# Patient Record
Sex: Female | Born: 1990 | Hispanic: No | Marital: Single | Smoking: Never smoker
Health system: Southern US, Community
[De-identification: ages and names within clinical notes are randomized; demographics above are authoritative.]

## PROBLEM LIST (undated history)

## (undated) DIAGNOSIS — D649 Anemia, unspecified: Secondary | ICD-10-CM

## (undated) DIAGNOSIS — A599 Trichomoniasis, unspecified: Secondary | ICD-10-CM

## (undated) DIAGNOSIS — L309 Dermatitis, unspecified: Secondary | ICD-10-CM

## (undated) HISTORY — PX: COSMETIC SURGERY: SHX468

---
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
## 2011-04-06 LAB — ABO/RH: RH Type: POSITIVE

## 2011-04-12 LAB — HIV ANTIBODY (ROUTINE TESTING W REFLEX): HIV: NONREACTIVE

## 2011-04-12 LAB — ANTIBODY SCREEN: Antibody Screen: NEGATIVE

## 2011-07-06 LAB — RPR: RPR: NONREACTIVE

## 2011-08-22 ENCOUNTER — Inpatient Hospital Stay (HOSPITAL_COMMUNITY)
Admission: AD | Admit: 2011-08-22 | Discharge: 2011-08-22 | Disposition: A | Discharge status: Home / Self Care | Payer: Medicaid Other | Source: Ambulatory Visit | Attending: Obstetrics & Gynecology | Admitting: Obstetrics & Gynecology

## 2011-08-22 ENCOUNTER — Encounter (HOSPITAL_COMMUNITY): Payer: Self-pay

## 2011-08-22 DIAGNOSIS — R059 Cough, unspecified: Secondary | ICD-10-CM | POA: Insufficient documentation

## 2011-08-22 DIAGNOSIS — O99891 Other specified diseases and conditions complicating pregnancy: Secondary | ICD-10-CM | POA: Insufficient documentation

## 2011-08-22 DIAGNOSIS — O368190 Decreased fetal movements, unspecified trimester, not applicable or unspecified: Secondary | ICD-10-CM

## 2011-08-22 DIAGNOSIS — R05 Cough: Secondary | ICD-10-CM | POA: Insufficient documentation

## 2011-08-22 HISTORY — DX: Trichomoniasis, unspecified: A59.9

## 2011-08-22 HISTORY — DX: Anemia, unspecified: D64.9

## 2011-08-22 LAB — URINALYSIS, ROUTINE W REFLEX MICROSCOPIC
Nitrite: NEGATIVE
Protein, ur: NEGATIVE mg/dL
Specific Gravity, Urine: 1.025 (ref 1.005–1.030)
Urobilinogen, UA: 1 mg/dL (ref 0.0–1.0)

## 2011-08-22 NOTE — ED Provider Notes (Signed)
History     No chief complaint on file.  HPI This is a 20 y.o. female at [redacted]w[redacted]d who presents with c/o decreased fetal movement today. Denies contractions or bleeding. Also c/o one episode of "coughing up blood" yesterday that was bright red. States has had some nose bleeding and is not sure if she coughed up the blood from her lungs or spit up from her stomach, has no idea. No recent cough or sore throat. Has occasional acid reflux.   OB History    Grav Para Term Preterm Abortions TAB SAB Ect Mult Living   1               Past Medical History  Diagnosis Date  . Trichomonas   . Anemia     History reviewed. No pertinent past surgical history.  History reviewed. No pertinent family history.  History  Substance Use Topics  . Smoking status: Never Smoker   . Smokeless tobacco: Not on file  . Alcohol Use: No    Allergies: Not on File  Prescriptions prior to admission  Medication Sig Dispense Refill  . FERROUS SULFATE PO Take 1 capsule by mouth daily.        . prenatal vitamin w/FE, FA (PRENATAL 1 + 1) 27-1 MG TABS Take 1 tablet by mouth daily.          ROS As above   Physical Exam   Height 5\' 3"  (1.6 m), weight 176 lb 6 oz (80.003 kg).  Physical Exam No apparent distress.  Chest clear to ascultation, no rales or rhonchi or wheezes. HR RRR Abdomen soft and nontender. EFM:  Reactive FHR with irregular cramping, each contraction lasts 20 seconds Lots of audible fetal movement now. Ext wnl  MAU Course  Procedures  MDM Consulted Dr Holley Dexter, who said to reassure patient and followup in office  Assessment and Plan  A:  IUP at [redacted]w[redacted]d      Decreased fetal movement episode, now very active      Episode of coughing or vomiting blood, no obvious source/cause P:  Reassured      Instructed to call or come back if recurs or if decreased movement again.      Follow up in office   [facility] 08/22/2011, 8:45 PM

## 2011-08-22 NOTE — Progress Notes (Signed)
Patient is here with c/o no fetal movement all day. She states that she called her doctor's office but didn't receive a call back. She states that she coughed yesterday and today and spitted blood out. She c/o lower back pain. Denies dysuria, vaginal bleeding, lof or discharge.

## 2011-09-13 NOTE — L&D Delivery Note (Signed)
Delivery Note At 2:20 AM a viable female was delivered via Vaginal, Spontaneous Delivery (Presentation: Left Occiput Anterior).  APGAR: 8, 9; weight 3325g.   Placenta status: Intact, Spontaneous.  Cord: 3 vessels with the following complications: None.    Anesthesia: Epidural  Episiotomy: None Lacerations: None Est. Blood Loss (mL): 300  Mom to postpartum.  Baby to nursery-stable.  HAMBY, Kashonda Sarkisyan 10/06/2011, 2:38 AM

## 2011-10-04 ENCOUNTER — Inpatient Hospital Stay (HOSPITAL_COMMUNITY)
Admission: AD | Admit: 2011-10-04 | Discharge: 2011-10-04 | Disposition: A | Discharge status: Home / Self Care | Payer: Medicaid Other | Source: Ambulatory Visit | Attending: Obstetrics | Admitting: Obstetrics

## 2011-10-04 ENCOUNTER — Encounter (HOSPITAL_COMMUNITY): Payer: Self-pay | Admitting: *Deleted

## 2011-10-04 DIAGNOSIS — O479 False labor, unspecified: Secondary | ICD-10-CM | POA: Insufficient documentation

## 2011-10-04 NOTE — Progress Notes (Signed)
Pt states, " I've been having contractions since 9 am, and they got close about an hour ago. Now they are five minutes apart."

## 2011-10-04 NOTE — Progress Notes (Signed)
Pt states she has been having pain since this morning 1100. Pt states her contractions are coming every 5 min. Pt states  She feel like she is going to throw up

## 2011-10-05 ENCOUNTER — Encounter (HOSPITAL_COMMUNITY): Payer: Self-pay | Admitting: *Deleted

## 2011-10-05 ENCOUNTER — Encounter (HOSPITAL_COMMUNITY): Payer: Self-pay | Admitting: Anesthesiology

## 2011-10-05 ENCOUNTER — Inpatient Hospital Stay (HOSPITAL_COMMUNITY)
Admission: AD | Admit: 2011-10-05 | Discharge: 2011-10-08 | DRG: 775 | Disposition: A | Discharge status: Home / Self Care | Payer: Medicaid Other | Source: Ambulatory Visit | Attending: Obstetrics | Admitting: Obstetrics

## 2011-10-05 ENCOUNTER — Inpatient Hospital Stay (HOSPITAL_COMMUNITY): Payer: Medicaid Other | Admitting: Anesthesiology

## 2011-10-05 DIAGNOSIS — O09219 Supervision of pregnancy with history of pre-term labor, unspecified trimester: Secondary | ICD-10-CM

## 2011-10-05 DIAGNOSIS — I471 Supraventricular tachycardia: Secondary | ICD-10-CM

## 2011-10-05 LAB — RPR: RPR Ser Ql: NONREACTIVE

## 2011-10-05 LAB — CBC
Hemoglobin: 12.3 g/dL (ref 12.0–15.0)
MCH: 27.6 pg (ref 26.0–34.0)
MCHC: 32.5 g/dL (ref 30.0–36.0)
RDW: 16 % — ABNORMAL HIGH (ref 11.5–15.5)

## 2011-10-05 MED ORDER — LIDOCAINE HCL (PF) 1 % IJ SOLN
30.0000 mL | INTRAMUSCULAR | Status: DC | PRN
  Filled 2011-10-05: qty 30

## 2011-10-05 MED ORDER — DIPHENHYDRAMINE HCL 50 MG/ML IJ SOLN: 12.5000 mg | INTRAMUSCULAR | Status: DC | PRN

## 2011-10-05 MED ORDER — LACTATED RINGERS IV SOLN
INTRAVENOUS | Status: DC
  Administered 2011-10-05: 125 mL/h via INTRAVENOUS
  Administered 2011-10-05 – 2011-10-06 (×3): via INTRAVENOUS

## 2011-10-05 MED ORDER — NALBUPHINE SYRINGE 5 MG/0.5 ML
10.0000 mg | INJECTION | Freq: Four times a day (QID) | INTRAMUSCULAR | Status: DC | PRN
  Administered 2011-10-05: 10 mg via INTRAMUSCULAR
  Filled 2011-10-05 (×3): qty 1

## 2011-10-05 MED ORDER — EPHEDRINE 5 MG/ML INJ: 10.0000 mg | INTRAVENOUS | Status: DC | PRN

## 2011-10-05 MED ORDER — EPHEDRINE 5 MG/ML INJ
10.0000 mg | INTRAVENOUS | Status: DC | PRN
  Filled 2011-10-05: qty 4

## 2011-10-05 MED ORDER — OXYTOCIN 20 UNITS IN LACTATED RINGERS INFUSION - SIMPLE
1.0000 m[IU]/min | INTRAVENOUS | Status: DC
  Administered 2011-10-05: 2 m[IU]/min via INTRAVENOUS
  Filled 2011-10-05: qty 1000

## 2011-10-05 MED ORDER — FENTANYL 2.5 MCG/ML BUPIVACAINE 1/10 % EPIDURAL INFUSION (WH - ANES)
INTRAMUSCULAR | Status: DC | PRN
  Administered 2011-10-05: 14 mL/h via EPIDURAL

## 2011-10-05 MED ORDER — PHENYLEPHRINE 40 MCG/ML (10ML) SYRINGE FOR IV PUSH (FOR BLOOD PRESSURE SUPPORT)
80.0000 ug | PREFILLED_SYRINGE | INTRAVENOUS | Status: DC | PRN
  Filled 2011-10-05: qty 5

## 2011-10-05 MED ORDER — ACETAMINOPHEN 325 MG PO TABS: 650.0000 mg | ORAL_TABLET | ORAL | Status: DC | PRN

## 2011-10-05 MED ORDER — OXYCODONE-ACETAMINOPHEN 5-325 MG PO TABS
2.0000 | ORAL_TABLET | ORAL | Status: DC | PRN
  Administered 2011-10-06: 2 via ORAL
  Filled 2011-10-05: qty 2

## 2011-10-05 MED ORDER — SODIUM BICARBONATE 8.4 % IV SOLN
INTRAVENOUS | Status: DC | PRN
  Administered 2011-10-05: 4 mL via EPIDURAL

## 2011-10-05 MED ORDER — ONDANSETRON HCL 4 MG/2ML IJ SOLN: 4.0000 mg | Freq: Four times a day (QID) | INTRAMUSCULAR | Status: DC | PRN

## 2011-10-05 MED ORDER — NALBUPHINE SYRINGE 5 MG/0.5 ML
10.0000 mg | INJECTION | INTRAMUSCULAR | Status: DC | PRN
  Administered 2011-10-05 (×2): 10 mg via INTRAVENOUS
  Filled 2011-10-05 (×3): qty 1

## 2011-10-05 MED ORDER — IBUPROFEN 600 MG PO TABS
600.0000 mg | ORAL_TABLET | Freq: Four times a day (QID) | ORAL | Status: DC | PRN
  Administered 2011-10-06: 600 mg via ORAL
  Filled 2011-10-05: qty 1

## 2011-10-05 MED ORDER — OXYTOCIN BOLUS FROM INFUSION
500.0000 mL | Freq: Once | INTRAVENOUS | Status: DC
  Filled 2011-10-05: qty 500

## 2011-10-05 MED ORDER — FENTANYL 2.5 MCG/ML BUPIVACAINE 1/10 % EPIDURAL INFUSION (WH - ANES)
7.0000 mL/h | INTRAMUSCULAR | Status: DC
  Administered 2011-10-05 (×2): 14 mL/h via EPIDURAL
  Filled 2011-10-05 (×3): qty 60

## 2011-10-05 MED ORDER — CITRIC ACID-SODIUM CITRATE 334-500 MG/5ML PO SOLN: 30.0000 mL | ORAL | Status: DC | PRN

## 2011-10-05 MED ORDER — PHENYLEPHRINE 40 MCG/ML (10ML) SYRINGE FOR IV PUSH (FOR BLOOD PRESSURE SUPPORT): 80.0000 ug | PREFILLED_SYRINGE | INTRAVENOUS | Status: DC | PRN

## 2011-10-05 MED ORDER — HYDROXYZINE HCL 50 MG/ML IM SOLN
50.0000 mg | Freq: Four times a day (QID) | INTRAMUSCULAR | Status: DC | PRN
  Filled 2011-10-05 (×2): qty 1

## 2011-10-05 MED ORDER — LACTATED RINGERS IV SOLN: 500.0000 mL | Freq: Once | INTRAVENOUS | Status: DC

## 2011-10-05 MED ORDER — LACTATED RINGERS IV SOLN: 500.0000 mL | INTRAVENOUS | Status: DC | PRN

## 2011-10-05 MED ORDER — FLEET ENEMA 7-19 GM/118ML RE ENEM: 1.0000 | ENEMA | RECTAL | Status: DC | PRN

## 2011-10-05 MED ORDER — OXYTOCIN 20 UNITS IN LACTATED RINGERS INFUSION - SIMPLE
125.0000 mL/h | Freq: Once | INTRAVENOUS | Status: AC
  Administered 2011-10-06: 999 mL/h via INTRAVENOUS

## 2011-10-05 MED ORDER — TERBUTALINE SULFATE 1 MG/ML IJ SOLN: 0.2500 mg | Freq: Once | INTRAMUSCULAR | Status: AC | PRN

## 2011-10-05 NOTE — Progress Notes (Signed)
Called MD.  No answer.  Left message to call back.

## 2011-10-05 NOTE — H&P (Signed)
Cheryl Benson is a 21 y.o. female presenting for UC's. Maternal Medical History:  Reason for admission: Reason for admission: contractions.  Contractions: Onset was 6-12 hours ago.   Frequency: regular.   Duration is approximately 1 minute.   Perceived severity is moderate.    Fetal activity: Perceived fetal activity is normal.   Last perceived fetal movement was within the past hour.    Prenatal complications: no prenatal complications   OB History    Grav Para Term Preterm Abortions TAB SAB Ect Mult Living   1 0             Past Medical History  Diagnosis Date  . Trichomonas   . Anemia    History reviewed. No pertinent past surgical history. Family History: family history is not on file. Social History:  reports that she has never smoked. She has never used smokeless tobacco. She reports that she does not drink alcohol or use illicit drugs.  Review of Systems  All other systems reviewed and are negative.    Dilation: 4 Effacement (%): 80 Station: -1 Exam by:: Marijean Heath, RN Blood pressure 119/66, pulse 90, temperature 98 F (36.7 C), temperature source Oral, resp. rate 20, height 5\' 4"  (1.626 m), weight 83.008 kg (183 lb), SpO2 98.00%. Maternal Exam:  Uterine Assessment: Contraction strength is moderate.  Contraction duration is 1 minute. Abdomen: Patient reports no abdominal tenderness. Fetal presentation: vertex  Introitus: Normal vulva. Normal vagina.  Ferning test: not done.  Nitrazine test: not done. Amniotic fluid character: not assessed.  Pelvis: adequate for delivery.   Cervix: Cervix evaluated by digital exam.     Physical Exam  Nursing note and vitals reviewed. Constitutional: She is oriented to person, place, and time. She appears well-developed and well-nourished.  HENT:  Head: Normocephalic and atraumatic.  Cardiovascular: Normal rate and regular rhythm.   Respiratory: Effort normal and breath sounds normal.  GI: Soft.  Genitourinary: Vagina  normal and uterus normal.  Neurological: She is alert and oriented to person, place, and time.  Skin: Skin is warm and dry.  Psychiatric: She has a normal mood and affect. Her behavior is normal. Judgment and thought content normal.    Prenatal labs: ABO, Rh: O/Positive/-- (07/25 0000) Antibody: Negative (07/31 0000) Rubella: Immune (07/31 0000) RPR: Nonreactive (10/24 0000)  HBsAg: Negative (07/31 0000)  HIV: Non-reactive (07/31 0000)  GBS: Negative (12/28 0000)   Assessment/Plan: Term pregnancy.  Early labor.  Admit.  Routine.      HARPER,CHARLES A 10/05/2011, 9:34 AM

## 2011-10-05 NOTE — Progress Notes (Signed)
Cheryl Benson is a 21 y.o. G1P0 at [redacted]w[redacted]d by LMP admitted for active labor  Subjective: Feeling pressure and urge to push  Objective: BP 122/72  Pulse 121  Temp(Src) 98.6 F (37 C) (Axillary)  Resp 18  Ht 5\' 4"  (1.626 m)  Wt 83.008 kg (183 lb)  BMI 31.41 kg/m2  SpO2 99% I/O last 3 completed shifts: In: -  Out: 360 [Urine:360]    FHT:  FHR: 140 bpm, variability: moderate,  accelerations:  Present,  decelerations:  Absent UC:   regular, every 2-3 minutes, Pitocin at 58mu/min. SVE:   Dilation: 10 Effacement (%): 100 Station: +2 Exam by:: r hamby cnm  Labs: Lab Results  Component Value Date   WBC 21.3* 10/05/2011   HGB 12.3 10/05/2011   HCT 37.8 10/05/2011   MCV 84.8 10/05/2011   PLT 255 10/05/2011    Assessment / Plan: Augmentation of labor, progressing well, will begin to push.   Labor: Progressing normally Preeclampsia:  no signs or symptoms of toxicity Fetal Wellbeing:  Category I Pain Control:  Epidural I/D:  n/a Anticipated MOD:  NSVD  HAMBY, Damare Serano 10/05/2011, 11:06 PM

## 2011-10-05 NOTE — Progress Notes (Signed)
Cheryl Benson is a 21 y.o. G1P0 at [redacted]w[redacted]d by LMP admitted for active labor  Subjective: Comfortable with epidural, slight discomfort on lower right side only with contractions.   Objective: BP 120/61  Pulse 98  Temp(Src) 98.2 F (36.8 C) (Oral)  Resp 20  Ht 5\' 4"  (1.626 m)  Wt 83.008 kg (183 lb)  BMI 31.41 kg/m2  SpO2 99%      FHT:  FHR: 135 bpm, variability: moderate,  accelerations:  Present,  decelerations:  Absent UC:   toco not tracing adequately, IUPC placed and assessing.  SVE:   Dilation: 5 Effacement (%): 100 Station: -2 Exam by:: Alvino Blood, CNM  Labs: Lab Results  Component Value Date   WBC 21.3* 10/05/2011   HGB 12.3 10/05/2011   HCT 37.8 10/05/2011   MCV 84.8 10/05/2011   PLT 255 10/05/2011    Assessment / Plan: early labor, on pitocin for augmentation. AROM with fetal head well applied, clear fluid. IUPC inserted without difficulty. Pitocin to be titrated according to protocol.   Labor: Continue on Pitocin.  Preeclampsia:  no signs or symptoms of toxicity Fetal Wellbeing:  Category I Pain Control:  Epidural I/D:  n/a Anticipated MOD:  NSVD  HAMBY, Austin Herd CNM 10/05/2011, 6:20 PM

## 2011-10-05 NOTE — Progress Notes (Deleted)
Cheryl Benson is a 21 y.o. G1P0 at [redacted]w[redacted]d by LMP admitted for active labor  Subjective: Comfortable with epidural, slight discomfort on lower right side only with contractions.   Objective: BP 120/61  Pulse 98  Temp(Src) 98.2 F (36.8 C) (Oral)  Resp 20  Ht 5\' 4"  (1.626 m)  Wt 83.008 kg (183 lb)  BMI 31.41 kg/m2  SpO2 99%      FHT:  FHR: 135 bpm, variability: moderate,  accelerations:  Present,  decelerations:  Absent UC:   toco not tracing adequately, IUPC placed and assessing.  SVE:   Dilation: 5 Effacement (%): 100 Station: -2 Exam by:: Alvino Blood, CNM  Labs: Lab Results  Component Value Date   WBC 21.3* 10/05/2011   HGB 12.3 10/05/2011   HCT 37.8 10/05/2011   MCV 84.8 10/05/2011   PLT 255 10/05/2011    Assessment / Plan: early labor, on pitocin for augmentation. AROM with fetal head well applied, clear fluid. IUPC inserted without difficulty. Pitocin to be titrated according to protocol.   Labor: Continue on Pitocin.  Preeclampsia:  no signs or symptoms of toxicity Fetal Wellbeing:  Category I Pain Control:  Epidural I/D:  n/a Anticipated MOD:  NSVD  HAMBY, REBECCA CNM 10/05/2011, 6:15 PM

## 2011-10-05 NOTE — Progress Notes (Signed)
Cheryl Benson is a 21 y.o. G1P0 at [redacted]w[redacted]d by LMP admitted for active labor  Subjective: Feeling slight rectal and vaginal pressure.   Objective: BP 124/68  Pulse 89  Temp(Src) 98.1 F (36.7 C) (Oral)  Resp 18  Ht 5\' 4"  (1.626 m)  Wt 83.008 kg (183 lb)  BMI 31.41 kg/m2  SpO2 99% I/O last 3 completed shifts: In: -  Out: 360 [Urine:360]    FHT:  FHR: 135 bpm, variability: moderate,  accelerations:  Present,  decelerations:  Absent UC:   regular, every 2-3 minutes, Pitocin at 5mu/min SVE:   Dilation: 8 Effacement (%): 100 Station: -1 Exam by:: r Benson CNM  Labs: Lab Results  Component Value Date   WBC 21.3* 10/05/2011   HGB 12.3 10/05/2011   HCT 37.8 10/05/2011   MCV 84.8 10/05/2011   PLT 255 10/05/2011    Assessment / Plan: Augmentation of labor, progressing well  Labor: Progressing normally Preeclampsia:  no signs or symptoms of toxicity Fetal Wellbeing:  Category I Pain Control:  Epidural I/D:  n/a Anticipated MOD:  NSVD  Benson, Cheryl Corprew 10/05/2011, 8:29 PM

## 2011-10-05 NOTE — Anesthesia Preprocedure Evaluation (Addendum)
Anesthesia Evaluation  Patient identified by MRN, date of birth, ID band Patient awake    Reviewed: Allergy & Precautions, H&P , Patient's Chart, lab work & pertinent test results  Airway Mallampati: II TM Distance: >3 FB Neck ROM: full    Dental  (+) Teeth Intact   Pulmonary  clear to auscultation        Cardiovascular regular Normal    Neuro/Psych    GI/Hepatic   Endo/Other    Renal/GU      Musculoskeletal   Abdominal   Peds  Hematology   Anesthesia Other Findings  WBC noted to be elevated, Pt asymptomatic and afebrile today     Reproductive/Obstetrics (+) Pregnancy                          Anesthesia Physical Anesthesia Plan  ASA: II  Anesthesia Plan: Epidural   Post-op Pain Management:    Induction:   Airway Management Planned:   Additional Equipment:   Intra-op Plan:   Post-operative Plan:   Informed Consent: I have reviewed the patients History and Physical, chart, labs and discussed the procedure including the risks, benefits and alternatives for the proposed anesthesia with the patient or authorized representative who has indicated his/her understanding and acceptance.   Dental Advisory Given  Plan Discussed with:   Anesthesia Plan Comments: (Labs checked- platelets confirmed with RN in room. Fetal heart tracing, per RN, reported to be stable enough for sitting procedure. Discussed epidural, and patient consents to the procedure:  included risk of possible headache,backache, failed block, allergic reaction, and nerve injury. This patient was asked if she had any questions or concerns before the procedure started. )        Anesthesia Quick Evaluation

## 2011-10-05 NOTE — Anesthesia Procedure Notes (Signed)

## 2011-10-05 NOTE — Progress Notes (Signed)
Notified of pt presenting for labor check. Notified of VE earlier of 1/80/-2 and VE now of 3.5/80/-2.  Admit orders received.

## 2011-10-05 NOTE — Discharge Summary (Deleted)
Physician Discharge Summary  Patient ID: Cheryl Benson MRN: [id number] DOB/AGE: Jan 18, 1991 21 y.o.  Admit date: 10/05/2011 Discharge date: 10/05/2011  Admission Diagnoses:  33 weeks.  PSVT.  PTL. Discharge Diagnoses:  Same, Improved. Active Problems:  * No active hospital problems. *    Discharged Condition: good  Hospital Course: Adenosine Cardioversion doe at [facility].  Transferred to [facility] for observation and treatment of UC's.  Magnesium sulfate tocolysis instituted.  Consults: cardiology  Significant Diagnostic Studies: labs: CBC, CMET  Treatments: IV hydration, cardiac meds: Adenosine.  Magnesium Sulfate.  Discharge Exam: Blood pressure 119/66, pulse 90, temperature 98 F (36.7 C), temperature source Oral, resp. rate 20, height 5\' 4"  (1.626 m), weight 83.008 kg (183 lb), SpO2 98.00%. General appearance: alert and no distress Resp: clear to auscultation bilaterally Cardio: regular rate and rhythm, S1, S2 normal, no murmur, click, rub or gallop Pulses: 2+ and symmetric Skin: Skin color, texture, turgor normal. No rashes or lesions Cervix:  Long/closed Disposition: Home or Self Care  Discharge Orders    Future Orders Please Complete By Expires   Discharge instructions      Comments:   Routine   PRETERM LABOR:  Includes any of the follwing symptoms that occur between 20 - [redacted] weeks gestation.  If these symptoms are not stopped, preterm labor can result in preterm delivery, placing your baby at risk      Notify physician for menstrual like cramps      Notify physician for uterine contractions.  These may be painless and feel like the uterus is tightening or the baby is  "balling up"      Notify physician for low, dull backache, unrelieved by heat or Tylenol      Notify physician for intestinal cramps, with or without diarrhea, sometimes described as "gas pain"      Notify physician for pelvic pressure      Notify physician for increase or change in vaginal discharge      Notify physician for vaginal bleeding      Notify physician for a general feeling that "something is not right"      Notify physician for leaking of fluid      Fetal Kick Count:  Lie on our left side for one hour after a meal, and count the number of times your baby kicks.  If it is less than 5 times, get up, move around and drink some juice.  Repeat the test 30 minutes later.  If it is still less than 5 kicks in an hour, notify your doctor.      Discharge activity:      Discharge diet:      Do not have sex or do anything that might make you have an orgasm      Discharge patient      Strep B DNA probe      Comments:   This external order was created through the Results Console.   HIV antibody      Comments:   This external order was created through the Results Console.   GC/chlamydia probe amp, genital      Comments:   This external order was created through the Results Console.   GC/chlamydia probe amp, genital      Comments:   This external order was created through the Results Console.   Rubella antibody, IgM      Comments:   This external order was created through the Results Console.   Hepatitis B surface antigen  Comments:   This external order was created through the Results Console.   RPR      Comments:   This external order was created through the Results Console.   Antibody screen      Comments:   This external order was created through the Results Console.     Medication List  As of 10/05/2011  9:22 AM   TAKE these medications         prenatal multivitamin Tabs   Take 1 tablet by mouth daily.           Follow-up Information    Schedule an appointment as soon as possible for a visit with Roseanna Rainbow, MD.   Contact information:   [address], [address] [place] [address] [phone]          Signed: Brock Bad 10/05/2011, 9:22 AM

## 2011-10-05 NOTE — Progress Notes (Signed)
Pt may go to room 163. 

## 2011-10-05 NOTE — Progress Notes (Signed)
Pt returns with complaint of worsening contractions. Denies bleeding or ROM

## 2011-10-05 NOTE — Progress Notes (Signed)
Cheryl Benson is a 21 y.o. G1P0 at [redacted]w[redacted]d by LMP admitted for active labor  Subjective:   Objective: BP 119/66  Pulse 94  Temp(Src) 98 F (36.7 C) (Oral)  Resp 20  Ht 5\' 4"  (1.626 m)  Wt 83.008 kg (183 lb)  BMI 31.41 kg/m2  SpO2 98%      FHT:  FHR: 150 bpm, variability: moderate,  accelerations:  Present,  decelerations:  Absent UC:   regular, every 4 minutes SVE:   Dilation: 4 Effacement (%): 80 Station: -1 Exam by:: Cheryl Heath, RN  Labs: Lab Results  Component Value Date   WBC 21.3* 10/05/2011   HGB 12.3 10/05/2011   HCT 37.8 10/05/2011   MCV 84.8 10/05/2011   PLT 255 10/05/2011    Assessment / Plan: Spontaneous labor, progressing normally  Labor: Progressing normally Preeclampsia:  n/a Fetal Wellbeing:  Category I Pain Control:  Nubain/Vistaril.  Epidural upon request. I/D:  n/a Anticipated MOD:  NSVD  Cheryl Benson A 10/05/2011, 9:38 AM

## 2011-10-05 NOTE — Discharge Summary (Deleted)
  ADULT NUTRITION ASSESSMENT Date: 10/05/2011   Time: 9:16 AM PRETERM LABOR: Includes any of the following symptoms that occur between 20-[redacted] weeks gestation. If these symptoms are not stopped, preterm labor can result in preterm delivery, placing your baby at risk.  Notify your doctor if any of the following occur: 1. Menstrual-like cramps   5. Pelvic pressure  2. Uterine contractions. These may be painless and feel like the uterus is tightening or the baby is "balling up" 6. Increase or change in vaginal discharge  3. Low, dull backache, unrelieved by heat or Tylenol  7. Vaginal bleeding  4. Intestinal cramps, with our without diarrhea, sometimes 8. A general feeling that "something is not right"   9. Leaking of fluid described as "gas pain"    A. MEDICATION PRESCRIBED FOR HOME:  Prenatal Vitamins.  Other  C. DIET AT HOME: regular D. ACTIVITY: Rest E. SEXUAL ACTIVITY: Do not have sex or do anything that might make you have an orgasm F. FOLLOW-UP CARE:  Return to: Private Physician  Date: 10-07-11    Referral to Home Health Agency: N/A    G. DISCHARGE TEACHING: Pt verbalized understanding of instructions H. MATERNAL DISCHARGE TO: Home

## 2011-10-06 ENCOUNTER — Encounter (HOSPITAL_COMMUNITY): Payer: Self-pay | Admitting: *Deleted

## 2011-10-06 MED ORDER — MEDROXYPROGESTERONE ACETATE 150 MG/ML IM SUSP: 150.0000 mg | INTRAMUSCULAR | Status: DC | PRN

## 2011-10-06 MED ORDER — WITCH HAZEL-GLYCERIN EX PADS: 1.0000 "application " | MEDICATED_PAD | CUTANEOUS | Status: DC | PRN

## 2011-10-06 MED ORDER — FERROUS SULFATE 325 (65 FE) MG PO TABS
325.0000 mg | ORAL_TABLET | Freq: Two times a day (BID) | ORAL | Status: DC
  Administered 2011-10-06 – 2011-10-08 (×5): 325 mg via ORAL
  Filled 2011-10-06 (×5): qty 1

## 2011-10-06 MED ORDER — DIBUCAINE 1 % RE OINT: 1.0000 "application " | TOPICAL_OINTMENT | RECTAL | Status: DC | PRN

## 2011-10-06 MED ORDER — MAGNESIUM HYDROXIDE 400 MG/5ML PO SUSP: 30.0000 mL | ORAL | Status: DC | PRN

## 2011-10-06 MED ORDER — ONDANSETRON HCL 4 MG/2ML IJ SOLN: 4.0000 mg | INTRAMUSCULAR | Status: DC | PRN

## 2011-10-06 MED ORDER — SODIUM CHLORIDE 0.9 % IJ SOLN: 3.0000 mL | Freq: Two times a day (BID) | INTRAMUSCULAR | Status: DC

## 2011-10-06 MED ORDER — ZOLPIDEM TARTRATE 5 MG PO TABS: 5.0000 mg | ORAL_TABLET | Freq: Every evening | ORAL | Status: DC | PRN

## 2011-10-06 MED ORDER — SIMETHICONE 80 MG PO CHEW: 80.0000 mg | CHEWABLE_TABLET | ORAL | Status: DC | PRN

## 2011-10-06 MED ORDER — DIPHENHYDRAMINE HCL 25 MG PO CAPS: 25.0000 mg | ORAL_CAPSULE | Freq: Four times a day (QID) | ORAL | Status: DC | PRN

## 2011-10-06 MED ORDER — SODIUM CHLORIDE 0.9 % IV SOLN: 250.0000 mL | INTRAVENOUS | Status: DC | PRN

## 2011-10-06 MED ORDER — SENNOSIDES-DOCUSATE SODIUM 8.6-50 MG PO TABS
2.0000 | ORAL_TABLET | Freq: Every day | ORAL | Status: DC
  Administered 2011-10-06 – 2011-10-07 (×2): 2 via ORAL

## 2011-10-06 MED ORDER — ONDANSETRON HCL 4 MG PO TABS: 4.0000 mg | ORAL_TABLET | ORAL | Status: DC | PRN

## 2011-10-06 MED ORDER — SODIUM CHLORIDE 0.9 % IJ SOLN: 3.0000 mL | INTRAMUSCULAR | Status: DC | PRN

## 2011-10-06 MED ORDER — PRENATAL MULTIVITAMIN CH
1.0000 | ORAL_TABLET | Freq: Every day | ORAL | Status: DC
  Administered 2011-10-06 – 2011-10-08 (×3): 1 via ORAL
  Filled 2011-10-06 (×4): qty 1

## 2011-10-06 MED ORDER — LANOLIN HYDROUS EX OINT: TOPICAL_OINTMENT | CUTANEOUS | Status: DC | PRN

## 2011-10-06 MED ORDER — PRENATAL MULTIVITAMIN CH: 1.0000 | ORAL_TABLET | Freq: Every day | ORAL | Status: DC

## 2011-10-06 MED ORDER — OXYTOCIN 20 UNITS IN LACTATED RINGERS INFUSION - SIMPLE: 125.0000 mL/h | INTRAVENOUS | Status: DC | PRN

## 2011-10-06 MED ORDER — BENZOCAINE-MENTHOL 20-0.5 % EX AERO: 1.0000 "application " | INHALATION_SPRAY | CUTANEOUS | Status: DC | PRN

## 2011-10-06 MED ORDER — IBUPROFEN 600 MG PO TABS
600.0000 mg | ORAL_TABLET | Freq: Four times a day (QID) | ORAL | Status: DC
  Administered 2011-10-06 – 2011-10-08 (×9): 600 mg via ORAL
  Filled 2011-10-06 (×9): qty 1

## 2011-10-06 MED ORDER — TETANUS-DIPHTH-ACELL PERTUSSIS 5-2.5-18.5 LF-MCG/0.5 IM SUSP
0.5000 mL | Freq: Once | INTRAMUSCULAR | Status: AC
  Administered 2011-10-07: 0.5 mL via INTRAMUSCULAR
  Filled 2011-10-06: qty 0.5

## 2011-10-06 MED ORDER — OXYCODONE-ACETAMINOPHEN 5-325 MG PO TABS
1.0000 | ORAL_TABLET | ORAL | Status: DC | PRN
  Administered 2011-10-06: 1 via ORAL
  Filled 2011-10-06: qty 1

## 2011-10-06 MED ORDER — DEXTROSE 5 % IV SOLN
1.0000 g | Freq: Two times a day (BID) | INTRAVENOUS | Status: DC
  Administered 2011-10-06: 1 g via INTRAVENOUS
  Filled 2011-10-06 (×2): qty 1

## 2011-10-06 NOTE — Progress Notes (Signed)
Yarlin Breisch is a 21 y.o. G1P0 at [redacted]w[redacted]d by LMP admitted for active labor  Subjective: Feeling some pressure, pushing with contractions.   Objective: BP 111/45  Pulse 125  Temp(Src) 98.6 F (37 C) (Axillary)  Resp 18  Ht 5\' 4"  (1.626 m)  Wt 83.008 kg (183 lb)  BMI 31.41 kg/m2  SpO2 99% I/O last 3 completed shifts: In: -  Out: 360 [Urine:360]    FHT:  FHR: 145 bpm, variability: moderate,  accelerations:  Present,  decelerations:  Present variable decels noted with pushing. Position changed.  UC:   regular, every 2 minutes, Pitocin at 22mu/min SVE:   Dilation: 10 Effacement (%): 100 Station: +2 Exam by:: r hamby cnm  Labs: Lab Results  Component Value Date   WBC 21.3* 10/05/2011   HGB 12.3 10/05/2011   HCT 37.8 10/05/2011   MCV 84.8 10/05/2011   PLT 255 10/05/2011    Assessment / Plan: Some descent noted after one hour of pushing, Will labor down for 1 hour, and allow pt to rest. Decrease epidural rate prior to resumption of pushing.   Labor: Progressing normally Preeclampsia:  no signs or symptoms of toxicity Fetal Wellbeing:  Category II Pain Control:  Epidural I/D:  n/a Anticipated MOD:  NSVD  HAMBY, Estephanie Hubbs 10/06/2011, 12:15 AM

## 2011-10-06 NOTE — Progress Notes (Signed)
UR chart review completed.  

## 2011-10-06 NOTE — Progress Notes (Signed)
Delivery of a live viable female at 32.

## 2011-10-06 NOTE — Progress Notes (Signed)
Post Partum Day 1 Subjective: no complaints, up ad lib, voiding, tolerating PO and denies HA, ruq pain or blurred vision. Bottlefeeding infant. Continues to be febrile.   Objective: Blood pressure 116/47, pulse 119, temperature 100.6 F (38.1 C), temperature source Axillary, resp. rate 20, height 5\' 4"  (1.626 m), weight 83.008 kg (183 lb), SpO2 99.00%, unknown if currently breastfeeding.  Physical Exam:  General: alert, cooperative, appears stated age and no distress Lochia: appropriate Uterine Fundus: firm and nontender.  Incision: n/a DVT Evaluation: No evidence of DVT seen on physical exam. Negative Homan's sign. No cords or calf tenderness.   Basename 10/05/11 0620  HGB 12.3  HCT 37.8    Assessment/Plan: Contraception undecided. Continue antibiotics for now, Consult with Dr. Clearance Coots regarding plan of care.    LOS: 1 day   HAMBY, Kadeem Hyle 10/06/2011, 6:54 AM

## 2011-10-06 NOTE — Anesthesia Postprocedure Evaluation (Signed)
  Anesthesia Post Note  Patient: Cheryl Benson  Procedure(s) Performed: * No procedures listed *  Anesthesia type: Epidural  Patient location: Mother/Baby  Post pain: Pain level controlled  Post assessment: Post-op Vital signs reviewed  Last Vitals:  Filed Vitals:   10/06/11 0953  BP: 108/71  Pulse: 89  Temp: 36.4 C  Resp: 18    Post vital signs: Reviewed  Level of consciousness:alert  Complications: No apparent anesthesia complications

## 2011-10-07 LAB — CBC
Hemoglobin: 9.4 g/dL — ABNORMAL LOW (ref 12.0–15.0)
MCHC: 32.2 g/dL (ref 30.0–36.0)
WBC: 17.1 10*3/uL — ABNORMAL HIGH (ref 4.0–10.5)

## 2011-10-07 NOTE — Progress Notes (Signed)
Post Partum Day 1 Subjective: no complaints and tolerating PO  Objective: Blood pressure 93/57, pulse 79, temperature 98.1 F (36.7 C), temperature source Oral, resp. rate 18, height 5\' 4"  (1.626 m), weight 83.008 kg (183 lb), SpO2 99.00%, unknown if currently breastfeeding.  Physical Exam:  General: alert and no distress Lochia: appropriate Uterine Fundus: firm Incision: n/a DVT Evaluation: No evidence of DVT seen on physical exam.   Basename 10/07/11 0515 10/05/11 0620  HGB 9.4* 12.3  HCT 29.2* 37.8    Assessment/Plan: Plan for discharge tomorrow   LOS: 2 days   Cheryl Benson A 10/07/2011, 6:04 AM

## 2011-10-08 MED ORDER — IBUPROFEN 600 MG PO TABS: 600.0000 mg | ORAL_TABLET | Freq: Four times a day (QID) | ORAL | Status: DC

## 2011-10-08 NOTE — Discharge Summary (Signed)
Obstetric Discharge Summary Reason for Admission: onset of labor Prenatal Procedures: ultrasound Intrapartum Procedures: spontaneous vaginal delivery Postpartum Procedures: none Complications-Operative and Postpartum: none Hemoglobin  Date Value Range Status  10/07/2011 9.4* 12.0-15.0 (g/dL) Final     DELTA CHECK NOTED     REPEATED TO VERIFY     HCT  Date Value Range Status  10/07/2011 29.2* 36.0-46.0 (%) Final    Discharge Diagnoses: Term Pregnancy-delivered  Discharge Information: Date: 10/08/2011 Activity: pelvic rest Diet: routine Medications: PNV, Ibuprofen and Colace Condition: stable Instructions: refer to practice specific booklet Discharge to: home Follow-up Information    Follow up with Antionette Char A, MD. Schedule an appointment as soon as possible for a visit in 2 weeks.   Contact information:   [address], [address] [place] [address] [phone]          Newborn Data: Live born female  Birth Weight: 7 lb 5.3 oz (3325 g) APGAR: 8, 9  Home with mother.  HARPER,CHARLES A 10/08/2011, 8:35 AM

## 2011-10-08 NOTE — Progress Notes (Signed)
Post Partum Day 2 Subjective: no complaints  Objective: Blood pressure 113/68, pulse 72, temperature 98.1 F (36.7 C), temperature source Oral, resp. rate 16, height 5\' 4"  (1.626 m), weight 83.008 kg (183 lb), SpO2 98.00%, unknown if currently breastfeeding.  Physical Exam:  General: alert and no distress Lochia: appropriate Uterine Fundus: firm Incision: n/a DVT Evaluation: No evidence of DVT seen on physical exam.   Basename 10/07/11 0515  HGB 9.4*  HCT 29.2*    Assessment/Plan: Discharge home   LOS: 3 days   Talicia Sui A 10/08/2011, 8:26 AM

## 2013-09-11 ENCOUNTER — Encounter: Payer: Self-pay | Admitting: Obstetrics & Gynecology

## 2014-07-14 ENCOUNTER — Encounter (HOSPITAL_COMMUNITY): Payer: Self-pay | Admitting: *Deleted

## 2014-09-08 ENCOUNTER — Encounter: Payer: Self-pay | Admitting: *Deleted

## 2019-07-26 ENCOUNTER — Ambulatory Visit: Admit: 2019-07-26 | Payer: Self-pay | Admitting: Plastic and Reconstructive Surgery

## 2019-07-26 SURGERY — ABDOMINOPLASTY, LIPOSUCTION (COSMETIC)
Anesthesia: General | Site: Abdomen

## 2020-03-11 ENCOUNTER — Telehealth: Payer: Self-pay

## 2020-03-11 ENCOUNTER — Encounter: Payer: Self-pay | Admitting: Plastic and Reconstructive Surgery

## 2020-03-11 NOTE — Pre-Procedure Instructions (Signed)
·   ANESTHESIA GUIDELINES:    No PST required   SURGEON REQUIREMENT:    Per Posting and pt- No requirements   SPECIALIST NOTES/TEST RESULTS REQUESTED:    No   FUTURE PLAN/UPCOMING APPTS:    04/01/20- Sx pre-op   RECENT HOSPITALIZATION/ED VISIT:    No   OTHER/MISC:    METS- 5,Stop Bang- 2

## 2020-04-06 ENCOUNTER — Ambulatory Visit
Admission: RE | Admit: 2020-04-06 | Discharge: 2020-04-06 | Disposition: A | Discharge status: Home / Self Care | Payer: Medicaid Managed Care Other | Source: Ambulatory Visit | Attending: Plastic and Reconstructive Surgery | Admitting: Plastic and Reconstructive Surgery

## 2020-04-06 ENCOUNTER — Ambulatory Visit: Payer: Medicaid Managed Care Other | Admitting: Anesthesiology

## 2020-04-06 ENCOUNTER — Encounter: Payer: Self-pay | Admitting: Plastic and Reconstructive Surgery

## 2020-04-06 ENCOUNTER — Encounter
Admission: RE | Disposition: A | Discharge status: Home / Self Care | Payer: Self-pay | Source: Ambulatory Visit | Attending: Plastic and Reconstructive Surgery

## 2020-04-06 DIAGNOSIS — F17290 Nicotine dependence, other tobacco product, uncomplicated: Secondary | ICD-10-CM | POA: Insufficient documentation

## 2020-04-06 DIAGNOSIS — Z411 Encounter for cosmetic surgery: Secondary | ICD-10-CM | POA: Insufficient documentation

## 2020-04-06 HISTORY — DX: Dermatitis, unspecified: L30.9

## 2020-04-06 LAB — POCT PREGNANCY TEST, URINE HCG: POCT Pregnancy HCG Test, UR: NEGATIVE

## 2020-04-06 SURGERY — ABDOMINOPLASTY, LIPOSUCTION (COSMETIC)
Anesthesia: Anesthesia General | Site: Leg Upper | Laterality: Bilateral | Wound class: Clean

## 2020-04-06 MED ORDER — OXYCODONE HCL 5 MG PO TABS
ORAL_TABLET | ORAL | Status: AC
Start: 2020-04-06 — End: ?
  Filled 2020-04-06: qty 1

## 2020-04-06 MED ORDER — SCOPOLAMINE 1 MG/3DAYS TD PT72
MEDICATED_PATCH | TRANSDERMAL | Status: AC
Start: 2020-04-06 — End: ?
  Filled 2020-04-06: qty 1

## 2020-04-06 MED ORDER — CEFAZOLIN SODIUM 1 G IJ SOLR
INTRAMUSCULAR | Status: AC
Start: 2020-04-06 — End: ?
  Filled 2020-04-06: qty 2000

## 2020-04-06 MED ORDER — LIDOCAINE-EPINEPHRINE 1 %-1:100000 IJ SOLN
INTRAMUSCULAR | Status: DC | PRN
Start: 2020-04-06 — End: 2020-04-06
  Administered 2020-04-06 (×2): 9 mL

## 2020-04-06 MED ORDER — PHENYLEPHRINE 100 MCG/ML IV SOSY (WRAP)
PREFILLED_SYRINGE | INTRAVENOUS | Status: AC
Start: 2020-04-06 — End: ?
  Filled 2020-04-06: qty 10

## 2020-04-06 MED ORDER — SCOPOLAMINE 1 MG/3DAYS TD PT72
1.0000 | MEDICATED_PATCH | Freq: Once | TRANSDERMAL | Status: DC
Start: 2020-04-06 — End: 2020-04-06
  Administered 2020-04-06: 07:00:00 1 via TRANSDERMAL

## 2020-04-06 MED ORDER — HYDROMORPHONE HCL 1 MG/ML IJ SOLN
0.5000 mg | INTRAMUSCULAR | Status: DC | PRN
Start: 2020-04-06 — End: 2020-04-06

## 2020-04-06 MED ORDER — LACTATED RINGERS IV SOLN
INTRAVENOUS | Status: AC | PRN
Start: 2020-04-06 — End: 2020-04-06
  Administered 2020-04-06: 08:00:00 150 mL via SUBCUTANEOUS
  Administered 2020-04-06 (×2): 1000 mL via SUBCUTANEOUS

## 2020-04-06 MED ORDER — METOPROLOL TARTRATE 5 MG/5ML IV SOLN
INTRAVENOUS | Status: AC
Start: 2020-04-06 — End: ?
  Filled 2020-04-06: qty 5

## 2020-04-06 MED ORDER — DEXAMETHASONE SODIUM PHOSPHATE 4 MG/ML IJ SOLN
INTRAMUSCULAR | Status: AC
Start: 2020-04-06 — End: ?
  Filled 2020-04-06: qty 1

## 2020-04-06 MED ORDER — METOPROLOL TARTRATE 5 MG/5ML IV SOLN
INTRAVENOUS | Status: DC | PRN
Start: 2020-04-06 — End: 2020-04-06
  Administered 2020-04-06 (×5): 1 mg via INTRAVENOUS

## 2020-04-06 MED ORDER — PROPOFOL INFUSION 10 MG/ML
INTRAVENOUS | Status: DC | PRN
Start: 2020-04-06 — End: 2020-04-06
  Administered 2020-04-06: 50 ug/kg/min via INTRAVENOUS

## 2020-04-06 MED ORDER — ACETAMINOPHEN 500 MG PO TABS
ORAL_TABLET | ORAL | Status: AC
Start: 2020-04-06 — End: ?
  Filled 2020-04-06: qty 2

## 2020-04-06 MED ORDER — ACETAMINOPHEN 500 MG PO TABS
1000.0000 mg | ORAL_TABLET | Freq: Once | ORAL | Status: AC
Start: 2020-04-06 — End: 2020-04-06
  Administered 2020-04-06: 07:00:00 1000 mg via ORAL

## 2020-04-06 MED ORDER — CEFAZOLIN SODIUM 1 G IJ SOLR
INTRAMUSCULAR | Status: DC | PRN
Start: 2020-04-06 — End: 2020-04-06
  Administered 2020-04-06 (×2): 2 g via INTRAVENOUS

## 2020-04-06 MED ORDER — MAGNESIUM SULFATE 50 % IJ SOLN
INTRAMUSCULAR | Status: DC | PRN
Start: 2020-04-06 — End: 2020-04-06
  Administered 2020-04-06: 2 g via INTRAVENOUS

## 2020-04-06 MED ORDER — ESMOLOL HCL 100 MG/10ML IV SOLN
INTRAVENOUS | Status: AC
Start: 2020-04-06 — End: ?
  Filled 2020-04-06: qty 10

## 2020-04-06 MED ORDER — FAMOTIDINE 10 MG/ML IV SOLN (WRAP)
INTRAVENOUS | Status: DC | PRN
Start: 2020-04-06 — End: 2020-04-06
  Administered 2020-04-06: 20 mg via INTRAVENOUS

## 2020-04-06 MED ORDER — GABAPENTIN 100 MG PO CAPS
ORAL_CAPSULE | ORAL | Status: AC
Start: 2020-04-06 — End: ?
  Filled 2020-04-06: qty 1

## 2020-04-06 MED ORDER — LUBRIFRESH P.M. OP OINT
TOPICAL_OINTMENT | OPHTHALMIC | Status: DC | PRN
Start: 2020-04-06 — End: 2020-04-06
  Administered 2020-04-06: 1 via OPHTHALMIC

## 2020-04-06 MED ORDER — GABAPENTIN 100 MG PO CAPS
100.0000 mg | ORAL_CAPSULE | Freq: Once | ORAL | Status: AC
Start: 2020-04-06 — End: 2020-04-06
  Administered 2020-04-06: 07:00:00 100 mg via ORAL

## 2020-04-06 MED ORDER — KETAMINE HCL 50 MG/ML IJ SOLN
INTRAMUSCULAR | Status: AC
Start: 2020-04-06 — End: ?
  Filled 2020-04-06: qty 10

## 2020-04-06 MED ORDER — MAGNESIUM SULFATE 50 % IJ SOLN
INTRAMUSCULAR | Status: AC
Start: 2020-04-06 — End: ?
  Filled 2020-04-06: qty 2

## 2020-04-06 MED ORDER — KETAMINE HCL 50 MG/ML IJ SOLN
INTRAMUSCULAR | Status: DC | PRN
Start: 2020-04-06 — End: 2020-04-06
  Administered 2020-04-06: 30 mg via INTRAVENOUS

## 2020-04-06 MED ORDER — ONDANSETRON HCL 4 MG/2ML IJ SOLN
INTRAMUSCULAR | Status: DC | PRN
Start: 2020-04-06 — End: 2020-04-06
  Administered 2020-04-06: 4 mg via INTRAVENOUS

## 2020-04-06 MED ORDER — PHENYLEPHRINE 100 MCG/ML IV BOLUS (ANESTHESIA)
PREFILLED_SYRINGE | INTRAVENOUS | Status: DC | PRN
Start: 2020-04-06 — End: 2020-04-06
  Administered 2020-04-06 (×8): 100 ug via INTRAVENOUS

## 2020-04-06 MED ORDER — PROPOFOL 10 MG/ML IV EMUL (WRAP)
INTRAVENOUS | Status: AC
Start: 2020-04-06 — End: ?
  Filled 2020-04-06: qty 20

## 2020-04-06 MED ORDER — ALBUMIN HUMAN 5 % IV SOLN
INTRAVENOUS | Status: AC
Start: 2020-04-06 — End: ?
  Filled 2020-04-06: qty 500

## 2020-04-06 MED ORDER — LIDOCAINE HCL URETHRAL/MUCOSAL 2 % EX GEL
CUTANEOUS | Status: AC
Start: 2020-04-06 — End: ?
  Filled 2020-04-06: qty 0.5

## 2020-04-06 MED ORDER — PROMETHAZINE HCL 25 MG/ML IJ SOLN
6.2500 mg | Freq: Once | INTRAMUSCULAR | Status: DC | PRN
Start: 2020-04-06 — End: 2020-04-06

## 2020-04-06 MED ORDER — ALBUMIN HUMAN 5 % IV SOLN
INTRAVENOUS | Status: DC | PRN
Start: 2020-04-06 — End: 2020-04-06

## 2020-04-06 MED ORDER — BUPIVACAINE HCL 0.25 % IJ SOLN
INTRAMUSCULAR | Status: AC
Start: 2020-04-06 — End: ?
  Filled 2020-04-06: qty 1

## 2020-04-06 MED ORDER — BUPIVACAINE HCL (PF) 0.25 % IJ SOLN
INTRAMUSCULAR | Status: DC | PRN
Start: 2020-04-06 — End: 2020-04-06
  Administered 2020-04-06 (×2): 30 mL

## 2020-04-06 MED ORDER — GLYCOPYRROLATE 0.2 MG/ML IJ SOLN (WRAP)
INTRAMUSCULAR | Status: AC
Start: 2020-04-06 — End: ?
  Filled 2020-04-06: qty 1

## 2020-04-06 MED ORDER — OXYCODONE HCL 5 MG PO TABS
5.0000 mg | ORAL_TABLET | Freq: Once | ORAL | Status: AC | PRN
Start: 2020-04-06 — End: 2020-04-06
  Administered 2020-04-06: 5 mg via ORAL

## 2020-04-06 MED ORDER — FENTANYL CITRATE (PF) 50 MCG/ML IJ SOLN (WRAP)
INTRAMUSCULAR | Status: DC | PRN
Start: 2020-04-06 — End: 2020-04-06
  Administered 2020-04-06: 25 ug via INTRAVENOUS
  Administered 2020-04-06: 50 ug via INTRAVENOUS
  Administered 2020-04-06: 25 ug via INTRAVENOUS
  Administered 2020-04-06: 50 ug via INTRAVENOUS

## 2020-04-06 MED ORDER — LIDOCAINE HCL 2 % EX GEL (WRAP)
CUTANEOUS | Status: DC | PRN
Start: 2020-04-06 — End: 2020-04-06
  Administered 2020-04-06: 5 mL via INTRATRACHEAL

## 2020-04-06 MED ORDER — SODIUM CHLORIDE 0.9 % IR SOLN
Status: DC | PRN
Start: 2020-04-06 — End: 2020-04-06
  Administered 2020-04-06: 500 mL

## 2020-04-06 MED ORDER — FENTANYL CITRATE (PF) 50 MCG/ML IJ SOLN (WRAP)
25.0000 ug | INTRAMUSCULAR | Status: DC | PRN
Start: 2020-04-06 — End: 2020-04-06

## 2020-04-06 MED ORDER — GLYCOPYRROLATE 0.2 MG/ML IJ SOLN (WRAP)
INTRAMUSCULAR | Status: DC | PRN
Start: 2020-04-06 — End: 2020-04-06
  Administered 2020-04-06: .2 mg via INTRAVENOUS
  Administered 2020-04-06: .4 mg via INTRAVENOUS

## 2020-04-06 MED ORDER — NEOSTIGMINE METHYLSULFATE 1 MG/ML IJ/IV SOLN (WRAP)
Status: AC
Start: 2020-04-06 — End: ?
  Filled 2020-04-06: qty 5

## 2020-04-06 MED ORDER — PROPOFOL 10 MG/ML IV EMUL (WRAP)
INTRAVENOUS | Status: AC
Start: 2020-04-06 — End: ?
  Filled 2020-04-06: qty 50

## 2020-04-06 MED ORDER — DIPHENHYDRAMINE HCL 50 MG/ML IJ SOLN
INTRAMUSCULAR | Status: AC
Start: 2020-04-06 — End: ?
  Filled 2020-04-06: qty 1

## 2020-04-06 MED ORDER — ONDANSETRON HCL 4 MG/2ML IJ SOLN
INTRAMUSCULAR | Status: AC
Start: 2020-04-06 — End: ?
  Filled 2020-04-06: qty 2

## 2020-04-06 MED ORDER — LACTATED RINGERS IV SOLN
50.0000 mL/h | INTRAVENOUS | Status: DC
Start: 2020-04-06 — End: 2020-04-06

## 2020-04-06 MED ORDER — LUBRIFRESH P.M. OP OINT
TOPICAL_OINTMENT | OPHTHALMIC | Status: AC
Start: 2020-04-06 — End: ?
  Filled 2020-04-06: qty 3.5

## 2020-04-06 MED ORDER — DIPHENHYDRAMINE HCL 50 MG/ML IJ SOLN
INTRAMUSCULAR | Status: DC | PRN
Start: 2020-04-06 — End: 2020-04-06
  Administered 2020-04-06: 12.5 mg via INTRAVENOUS

## 2020-04-06 MED ORDER — FENTANYL CITRATE (PF) 50 MCG/ML IJ SOLN (WRAP)
INTRAMUSCULAR | Status: AC
Start: 2020-04-06 — End: ?
  Filled 2020-04-06: qty 2

## 2020-04-06 MED ORDER — LIDOCAINE HCL (PF) 2 % IJ SOLN
INTRAMUSCULAR | Status: AC
Start: 2020-04-06 — End: ?
  Filled 2020-04-06: qty 5

## 2020-04-06 MED ORDER — LACTATED RINGERS IV SOLN
INTRAVENOUS | Status: DC
Start: 2020-04-06 — End: 2020-04-06
  Administered 2020-04-06: 07:00:00 1000 mL via INTRAVENOUS

## 2020-04-06 MED ORDER — ESMOLOL HCL 100 MG/10ML IV SOLN
INTRAVENOUS | Status: DC | PRN
Start: 2020-04-06 — End: 2020-04-06
  Administered 2020-04-06 (×3): 10 mg via INTRAVENOUS

## 2020-04-06 MED ORDER — ROCURONIUM BROMIDE 50 MG/5ML IV SOLN
INTRAVENOUS | Status: AC
Start: 2020-04-06 — End: ?
  Filled 2020-04-06: qty 5

## 2020-04-06 MED ORDER — ROCURONIUM BROMIDE 10 MG/ML IV SOLN (WRAP)
INTRAVENOUS | Status: DC | PRN
Start: 2020-04-06 — End: 2020-04-06
  Administered 2020-04-06: 20 mg via INTRAVENOUS
  Administered 2020-04-06: 50 mg via INTRAVENOUS

## 2020-04-06 MED ORDER — HYDROMORPHONE HCL 1 MG/ML IJ SOLN
INTRAMUSCULAR | Status: AC
Start: 2020-04-06 — End: ?
  Filled 2020-04-06: qty 1

## 2020-04-06 MED ORDER — DEXAMETHASONE SODIUM PHOSPHATE 4 MG/ML IJ SOLN (WRAP)
INTRAMUSCULAR | Status: DC | PRN
Start: 2020-04-06 — End: 2020-04-06
  Administered 2020-04-06: 10 mg via INTRAVENOUS

## 2020-04-06 MED ORDER — LIDOCAINE HCL 2 % IJ SOLN
INTRAMUSCULAR | Status: DC | PRN
Start: 2020-04-06 — End: 2020-04-06
  Administered 2020-04-06: 80 mg

## 2020-04-06 MED ORDER — PROPOFOL 10 MG/ML IV EMUL (WRAP)
INTRAVENOUS | Status: DC | PRN
Start: 2020-04-06 — End: 2020-04-06
  Administered 2020-04-06: 30 mg via INTRAVENOUS
  Administered 2020-04-06: 10 mg via INTRAVENOUS
  Administered 2020-04-06: 250 mg via INTRAVENOUS
  Administered 2020-04-06: 10 mg via INTRAVENOUS

## 2020-04-06 MED ORDER — TRANEXAMIC ACID 2000MG/100 ML ADULT BOLUS
Status: DC | PRN
Start: 2020-04-06 — End: 2020-04-06
  Administered 2020-04-06: 1000 mg via INTRAVENOUS

## 2020-04-06 MED ORDER — EPINEPHRINE HCL 1 MG/ML IJ SOLN (WRAP)
Status: AC
Start: 2020-04-06 — End: ?
  Filled 2020-04-06: qty 5

## 2020-04-06 MED ORDER — NEOSTIGMINE METHYLSULFATE 1 MG/ML IJ/IV SOLN (WRAP)
Status: DC | PRN
Start: 2020-04-06 — End: 2020-04-06
  Administered 2020-04-06: 1 mg via INTRAVENOUS
  Administered 2020-04-06: 3 mg via INTRAVENOUS

## 2020-04-06 MED ORDER — ONDANSETRON HCL 4 MG/2ML IJ SOLN
4.0000 mg | Freq: Once | INTRAMUSCULAR | Status: DC | PRN
Start: 2020-04-06 — End: 2020-04-06

## 2020-04-06 MED ORDER — LIDOCAINE-EPINEPHRINE 1 %-1:100000 IJ SOLN
INTRAMUSCULAR | Status: AC
Start: 2020-04-06 — End: ?
  Filled 2020-04-06: qty 60

## 2020-04-06 MED ORDER — AMMONIA AROMATIC IN INHA
1.0000 | Freq: Once | RESPIRATORY_TRACT | Status: DC | PRN
Start: 2020-04-06 — End: 2020-04-06

## 2020-04-06 MED ORDER — GENTAMICIN SULFATE 40 MG/ML IJ SOLN
INTRAMUSCULAR | Status: AC
Start: 2020-04-06 — End: ?
  Filled 2020-04-06: qty 4

## 2020-04-06 MED ORDER — HYDROMORPHONE HCL 1 MG/ML IJ SOLN
INTRAMUSCULAR | Status: DC | PRN
Start: 2020-04-06 — End: 2020-04-06
  Administered 2020-04-06: .2 mg via INTRAVENOUS
  Administered 2020-04-06: .5 mg via INTRAVENOUS

## 2020-04-06 MED ORDER — CLINDAMYCIN PHOSPHATE 600 MG/4ML IJ SOLN
INTRAMUSCULAR | Status: AC
Start: 2020-04-06 — End: ?
  Filled 2020-04-06: qty 4

## 2020-04-06 MED ORDER — MIDAZOLAM HCL 1 MG/ML IJ SOLN (WRAP)
INTRAMUSCULAR | Status: DC | PRN
Start: 2020-04-06 — End: 2020-04-06
  Administered 2020-04-06: 2 mg via INTRAVENOUS

## 2020-04-06 MED ORDER — MIDAZOLAM HCL 1 MG/ML IJ SOLN (WRAP)
INTRAMUSCULAR | Status: AC
Start: 2020-04-06 — End: ?
  Filled 2020-04-06: qty 2

## 2020-04-06 SURGICAL SUPPLY — 145 items
ADHESIVE DERMABOND PRINEO 22CM (Skin Closure) ×2
ADHESIVE SKIN CLOSURE DERMABOND PRINEO (Skin Closure) ×6
ADHESIVE SKIN CLOSURE DERMABOND PRINEO LIQUID 2-OCTYL CYANOACRYLATE (Skin Closure) ×6 IMPLANT
AIRWAY NASO PVC RUSCH 30FR 165MM LF STRL (Respiratory Supplies) ×9 IMPLANT
AIRWAY NASOPHARYNGEAL 30F (Respiratory Supplies) ×3
BANDAGE ACE NONSTERILE 6IN LF (Bandage) ×2
BANDAGE COMPRESSION L5 YD X W4 IN COHESIVE SELF ADHERENT FOAM TAN (Bandage) ×6 IMPLANT
BANDAGE ELASTIC L5 YD X W6 IN W/SELF-CLOSURE HOOK (Bandage) ×6 IMPLANT
BANDAGE MEDLINE COMPRESSION L5 YD X W4 (Bandage) ×6
BANDAGE MEDLINE MEDIUM COMPRESSION L5 YD (Bandage) ×6
BANDAGE STERI-STRIP 0.5X4IN (Dressing) ×2
BDG FOM CFLX LF2 STL 4X5YD (Bandage) ×2
BLANKET LOWER BODY ALLIANCE (Patient Supply) ×1
BLANKET WARMING L60 IN X W36 IN BAIR (Patient Supply) ×3 IMPLANT
BULB DRAINAGE LIGHTWEIGHT LOW LEVEL (Drain) ×9
BULB DRAINAGE LIGHTWEIGHT LOW LEVEL SUCTION RELIAVAC SILICONE 100 CC (Drain) ×9 IMPLANT
CANNULA BFLRD MERC 4X10MMX30CM (Cannula) ×1
CANNULA FLRD MERC 4X10MMX30CM (Cannula) ×1
CANNULA LIPOSUCTION L30 CM BENT FLARE (Cannula) ×3
CANNULA LIPOSUCTION L30 CM BENT FLARE OD5 MM MERCEDES L10 MM (Cannula) ×3 IMPLANT
CANNULA MICROAIRE 5.0 (Cannula) ×1
CANNULA SUCTION L30 CM BENT PORT FLARED (Cannula) ×3
CANNULA SUCTION L30 CM BENT PORT FLARED MERCEDES TIP AGGRESSIVE OD4 MM (Cannula) ×3 IMPLANT
CANNULA SUCTION L30 CM PORT FLARED (Cannula) ×3
CANNULA SUCTION L30 CM PORT FLARED MERCEDES TIP AGGRESSIVE OD4 MM (Cannula) ×3 IMPLANT
CLOSURE STERI-STRIP 1X5IN (Dressing) ×1
DEVICE CLOSURE L45 CM 2-0 GS-21 V-LOC (Suture) ×6
DRAIN BLAKE RND 19F SILI HBLSS (Drain) ×3
DRAIN OD19 FR RADIOPAQUE 4 FREE FLOW (Drain) ×9
DRAIN OD19 FR RADIOPAQUE 4 FREE FLOW CHANNEL FULL FLUTE CHANNEL DRAIN (Drain) ×9 IMPLANT
DRAN EVACUATOR WOUND 100CC (Drain) ×3
DRESSING .5 IN L12 IN X W8 IN TOPIFOAM (Dressing) ×6
DRESSING .5 IN L12 IN X W8 IN TOPIFOAM FOAM SILICONE POLYURETHANE (Dressing) ×6 IMPLANT
DRESSING PETROLATUM XEROFORM L8 IN X W1 (Dressing) ×3
DRESSING PETROLATUM XEROFORM L8 IN X W1 IN 3% BISMUTH TRIBROMOPHENATE (Dressing) ×3 IMPLANT
DRESSING XEROFORM 1X8 (Dressing) ×1
ELECTRODE ADULT PATIENT RETURN L9 FT REM POLYHESIVE ACRYLIC FOAM (Procedure Accessories) ×3 IMPLANT
ELECTRODE PATIENT RETURN L9 FT VALLEYLAB (Procedure Accessories) ×3
GLOVE BIOGEL PI ORTHOPRO SZ8 (Glove) ×5
GLOVE SURGICAL 8 BIOGEL PI ORTHOPRO (Glove) ×15
GLOVE SURGICAL 8 BIOGEL PI ORTHOPRO POWDER FREE MICRO ROUGH TEXTURE (Glove) ×15 IMPLANT
GOWN SMART XLG (Gown) ×1
GOWN SURGICAL XL POLY BLUE LEVEL 4 (Gown) ×15
GOWN SURGICAL XL POLY BLUE LEVEL 4 IMPERVIOUS REINFORCE SET IN SLEEVE (Gown) ×15 IMPLANT
GOWN SURGICAL XL SMARTGOWN LEVEL 4 (Gown) ×3
GOWN SURGICAL XL SMARTGOWN LEVEL 4 BREATHABLE (Gown) ×3 IMPLANT
GOWN X-LRG POLY REINFORCED (Gown) ×5
JELLY LUBRICANT ADHESIVE WATER SOLUBLE (Other) ×6
JELLY LUBRICANT ADHESIVE WATER SOLUBLE FLIP TOP TUBE 2 OZ (Other) ×6 IMPLANT
JELLY LUBRICATING STERILE 2 OZ (Other) ×2
KIT STAPLE REMOVER L3 IN X W3 IN 4 IN 12 (Procedure Accessories) ×3
KIT STAPLE REMOVER L3 IN X W3 IN 4 IN 12 PLY SPONGE BASIC GAUZE (Procedure Accessories) ×3 IMPLANT
MRKR SKN W RULER AND LABELS (Positioning Supplies) ×3
NEEDLE 18GX1.5 (Needles) ×2
NEEDLE HPO SS PP RW PRCSNGL 18GA 1.5IN (Needles) ×6
NEEDLE HYPODERMIC L1 1/2 IN OD18 GA REGULAR WALL BD PRECISIONGLIDE (Needles) ×6 IMPLANT
PACK PLASTICS WOODBURN SC (Pack) ×4 IMPLANT
PACK SURGICAL UNIVERSAL ABSORBENT (Pack) ×3
PACK SURGICAL UNIVERSAL ABSORBENT REINFORCE ADHESIVE CORD HOLD STRAP (Pack) ×3 IMPLANT
PACK UNIVERSAL PROCED (Pack) ×1
PAD ABDOMINAL L9 IN X W5 IN 4 SEAL EDGE (Dressing) ×6 IMPLANT
PAD ARMBOARD FOAM 20X8X2IN (Positioning Supplies) ×3
PAD ARMBOARD L20 IN X W8 IN X H2 IN (Positioning Supplies) ×9
PAD ARMBOARD L20 IN X W8 IN X H2 IN CONVOLUTE FOAM PURPLE (Positioning Supplies) ×9 IMPLANT
PAD CURITY ABDOMINAL 5X9IN (Dressing) ×2
PAD ELECTROSRG GRND REM W CRD (Procedure Accessories) ×1
PAD PREP CUFF 24X41IN W 9IN (Prep) ×4
PAD SRGPRP 44X24IN NS CUF 9IN (Prep) ×12 IMPLANT
PAD TOPIFOAM ADHSV GEL 8X12IN (Dressing) ×2
PEN SURGICAL MARKING MEDLINE SKIN RULER (Positioning Supplies) ×9
PEN SURGICAL MARKING SKIN RULER LARGE RESERVOIR REGULAR TIP LABEL (Positioning Supplies) ×9 IMPLANT
POSITIONER HEAD DONUT 9IN (Positioning Supplies) ×4
POSITIONER HEAD FOAM 7X2", LF (Positioning Supplies) ×1
POSITIONER HEAD FOAM POSITIONING DONUT OD9 IN (Positioning Supplies) ×3 IMPLANT
POSITIONER OR HIGH RESILIENT CUSHION (Positioning Supplies) ×3
POSITIONER OR HIGH RESILIENT CUSHION MULTIRING FOAM HEAD RASPBERRY (Positioning Supplies) ×3 IMPLANT
REMOVER STAPLE SKIN STERILE (Procedure Accessories) ×1
REMOVER STAPLE TIP PREMIUM METAL PLASTIC (Staplers) ×3
REMOVER STAPLE TIP PREMIUM METAL PLASTIC SKIN (Staplers) ×3 IMPLANT
SCRUB DYNA-HEX 4% CHG 4OZ (Prep) ×2
SLEEVE CMPR MED KN LGTH KDL SCD 21- IN (Sleeve) ×3
SLEEVE COMPRESSION MEDIUM KNEE LENGTH KENDALL SEQUENTIAL OD21- IN (Sleeve) ×3 IMPLANT
SLEEVE SEQ COMP KNEE REGULAR (Sleeve) ×1
SOLUTION BETADINE 4OZ (Scrub Supplies) ×1
SOLUTION PREP LIQUID ANTIMICROBIAL FLIP (Prep) ×6
SOLUTION PREP LIQUID ANTIMICROBIAL FLIP TOP BOTTLE ANTISEPTIC DYNA-HEX (Prep) ×6 IMPLANT
SOLUTION SRGSCRB 10% PVP IOD 4OZ PLS BTL (Scrub Supplies) ×3
SOLUTION SURGICAL SCRUB 10% PVP IODINE 4OZ PLASTIC BOTTLE AQUEOUS SKIN (Scrub Supplies) ×3 IMPLANT
SPONGE GAUZE L2 IN X W2 IN 8 PLY PEEL (Dressing) ×3 IMPLANT
SPONGE GZE STR 2'S 8PLY 2X2 (Dressing) ×1
SPONGE LAPAROTOMY 18X18IN (Sponge) ×3
SPONGE LAPAROTOMY L18 IN X W18 IN 4 PLY (Sponge) ×9
SPONGE LAPAROTOMY L18 IN X W18 IN 4 PLY RADIOPAQUE PYRONEMA FREE HIGH (Sponge) ×9 IMPLANT
STAPLER APPOSE ULC 35 W SKIN (Staplers) ×2
STAPLER SKIN L4.1 MM X W6.5 MM 35 WIDE (Staplers) ×6
STAPLER SKIN L4.1 MM X W6.5 MM 35 WIDE STAPLE CARTRIDGE APPOSE ULC (Staplers) ×6 IMPLANT
STAPLER SKIN REMOVER PREMIUM (Staplers) ×1
STRAP STRCHR SONTARA PLSTR 60X3IN LF NS (Procedure Accessories) ×3
STRAP STRETCHER L60 IN X W3 IN OR TABLE HOOK SONTARA POLYESTER WHITE (Procedure Accessories) ×3 IMPLANT
STRAP STRETCHER/TABLE 3X60IN (Procedure Accessories) ×1
STRIP SKIN CLOSURE L4 IN X W1/2 IN (Dressing) ×6
STRIP SKIN CLOSURE L4 IN X W1/2 IN REINFORCE STERI-STRIP POLYESTER (Dressing) ×6 IMPLANT
STRIP SKIN CLOSURE L5 IN X W1 IN (Dressing) ×3
STRIP SKIN CLOSURE L5 IN X W1 IN REINFORCE STERI-STRIP POLYESTER WHITE (Dressing) ×3 IMPLANT
SUT VICRYL 3-0 SH 27IN (Suture) ×1
SUTURE 3-0 FS-1 24MM (Suture) ×2
SUTURE COATED VICRYL 0 CT-1 L18 IN (Suture) ×9 IMPLANT
SUTURE COATED VICRYL 3-0 SH L27 IN BRAID (Suture) ×3 IMPLANT
SUTURE ETHILON 3-0 18IN PS-1 (Suture) ×3
SUTURE ETHILON 4-0 P3 18IN (Suture) ×3
SUTURE ETHILON BLACK 3-0 FS-1 L18 IN (Suture) ×6 IMPLANT
SUTURE ETHILON BLACK 3-0 PS-1 L18 IN (Suture) ×9
SUTURE ETHILON BLACK 4-0 P-3 L18 IN (Suture) ×9
SUTURE ETHILON BLACK 4-0 P-3 L18 IN MONOFILAMENT NONABSORBABLE (Suture) ×9 IMPLANT
SUTURE MONOCRYL 3-0 PS-2 L27 IN (Suture) ×18
SUTURE MONOCRYL 3-0 PS-2 L27 IN MONOFILAMENT UNDYED ABSORBABLE (Suture) ×18 IMPLANT
SUTURE MONOCRYL 3-0 PS2 27IN (Suture) ×6
SUTURE MONOCRYL 4-0 P-3 L18 IN (Suture) ×9
SUTURE MONOCRYL 4-0 P-3 L18 IN MONOFILAMENT UNDYED ABSORBABLE (Suture) ×9 IMPLANT
SUTURE MONOCRYL 4-0 P3 18IN (Suture) ×3
SUTURE PDS II 1 CT-1 L27 IN MONOFILAMENT (Suture) ×12
SUTURE PDS II 1 CT-1 L27 IN MONOFILAMENT VIOLET ABSORBABLE (Suture) ×12 IMPLANT
SUTURE PDS II 1 CT1 27IN (Suture) ×4
SUTURE PDS II 3-0 SH 27IN (Suture) ×3
SUTURE PDS II 3-0 SH L27 IN MONOFILAMENT (Suture) ×9
SUTURE PDS II 3-0 SH L27 IN MONOFILAMENT VIOLET ABSORBABLE (Suture) ×9 IMPLANT
SUTURE POLYAMIDE ETHILON BLACK 3-0 PS-1 L18 IN MONOFILAMENT (Suture) ×9 IMPLANT
SUTURE V-LOC 180 2-0 GS-21 1/2 CIRCLE L18 IN ABS GREEN (Suture) ×6 IMPLANT
SUTURE VICRYL 0 CT-1 CR/8 (Suture) ×3
TOWEL OR L27 IN X W17 IN STANDARD (Procedure Accessories) ×6
TOWEL OR L27 IN X W17 IN STD STRGHT PREWASH DELINT MEDLN COTTON BLUE (Procedure Accessories) ×6 IMPLANT
TOWEL STANDARD 4PK 20PKCS BLUE (Procedure Accessories) ×2
TRAY CATH UMETER SLOCK 14F (Catheter Kits) ×4 IMPLANT
TRAY MAJOR PLASTIC FFX (Pack) ×1
TRAY SRG MAJOR PLASTIC IFMC (Pack) ×3
TRAY SURGICAL MAJOR PLASTIC ~~LOC~~ (Pack) ×3 IMPLANT
TUBING ASPIRATION 3/8X12 (Tubing) ×1
TUBING INFILTRATION SNG SPIKE (Procedure Accessories) ×1
TUBING PAL ASPIRATION 12FT (Tubing) ×1
TUBING SCT SIL SPK 156IN STRL INFLTR SLV (Procedure Accessories) ×3
TUBING SUCTION 156IN SPIKE SLCN INFILTRATION SLV SM BORE STRL DSPSBL (Procedure Accessories) ×3 IMPLANT
TUBING SUCTION L12 FT PSI-TEC (Tubing) ×3 IMPLANT
TUBING SUCTION L12 FT SET (Tubing) ×3
TUBING SUCTION L12 FT SET PAL (Tubing) ×3 IMPLANT
WNDCLSR ABS 18IN VLOC 2-0 GS21 (Suture) ×2

## 2020-04-06 NOTE — H&P (Signed)
H&P    Date Time: 04/06/20 7:27 AM  Patient Name: Sara Ewing  Requesting Physician: Park Breed, MD      Reason for Consultation:   Desires body contouring    Assessment:   Good candidate for tt and lipo    Plan:   Abdominoplasty  Liposuction trunk  Neck lipo possible  Arm liposuction    History:   Sara Ewing is a 29 y.o. female who presents to the hospital on 04/06/2020 with desire for body contouring. two children SVD. No hx dvt nor fhx.      Past Medical History:     Past Medical History:   Diagnosis Date    Eczema        Past Surgical History:   History reviewed. No pertinent surgical history.    Family History:   History reviewed. No pertinent family history.    Social History:     Social History     Socioeconomic History    Marital status: Single     Spouse name: Not on file    Number of children: Not on file    Years of education: Not on file    Highest education level: Not on file   Occupational History    Not on file   Tobacco Use    Smoking status: Current Every Day Smoker     Types: Pipe    Smokeless tobacco: Never Used    Tobacco comment: Hooka 2x/month   Vaping Use    Vaping Use: Never used   Substance and Sexual Activity    Alcohol use: Yes     Alcohol/week: 3.0 standard drinks     Types: 3 Shots of liquor per week     Comment: occassional    Drug use: Never    Sexual activity: Not on file   Other Topics Concern    Not on file   Social History Narrative    Not on file     Social Determinants of Health     Financial Resource Strain:     Difficulty of Paying Living Expenses:    Food Insecurity:     Worried About Programme researcher, broadcasting/film/video in the Last Year:     Barista in the Last Year:    Transportation Needs:     Freight forwarder (Medical):     Lack of Transportation (Non-Medical):    Physical Activity:     Days of Exercise per Week:     Minutes of Exercise per Session:    Stress:     Feeling of Stress :    Social Connections:     Frequency of  Communication with Friends and Family:     Frequency of Social Gatherings with Friends and Family:     Attends Religious Services:     Active Member of Clubs or Organizations:     Attends Banker Meetings:     Marital Status:    Intimate Partner Violence:     Fear of Current or Ex-Partner:     Emotionally Abused:     Physically Abused:     Sexually Abused:        Allergies:   No Known Allergies    Medications:     Current Facility-Administered Medications   Medication Dose Route Frequency    scopolamine  1 patch Transdermal Once       Review of Systems:   A comprehensive review of systems was: neg cardiac, pulm,  GI, derm    Physical Exam:     Vitals:    04/06/20 0659   BP: 119/69   Pulse: 69   Resp:    Temp: 98.1 F (36.7 C)   SpO2: 100%     nad  rrr  Normal work of breathing    Labs Reviewed:     Results     Procedure Component Value Units Date/Time    POCT Pregnancy Test, Urine HCG [[id number]] Collected: 04/06/20 0641     Updated: 04/06/20 0641     POCT QC Pass     POCT Pregnancy HCG Test, UR Negative     Comment: Negative Value is Normal in Healthy Males or Healthy non-pregnant Females          Rads:   Radiological Procedure reviewed.     Signed by: Park Breed

## 2020-04-06 NOTE — Brief Op Note (Signed)
BRIEF OP NOTE    Date Time: 04/06/20 2:00 PM    Patient Name:   Sara Ewing    Date of Operation:   04/06/2020    Providers Performing:   Surgeon(s):  Park Breed, MD    Assistant (s):   Circulator: Javier Glazier, RN  Relief Circulator: Eyvonne Left, RN  Relief Scrub: Delana Meyer, RN  Scrub Person: Toribio Harbour, RN  First Assistant: Haze Justin  Second Circulator: Jaynie Collins, RN    Operative Procedure:   Procedure(s):  ABDOMINOPLASTY, LIPOSUCTION (COSMETIC)  LIPOSUCTION, ARM (COSMETIC)    Preoperative Diagnosis:   Pre-Op Diagnosis Codes:     * Encounter for cosmetic surgery [Z41.1]    Postoperative Diagnosis:   Post-Op Diagnosis Codes:     * Encounter for cosmetic surgery [Z41.1]    Anesthesia:   General    Estimated Blood Loss:    100 mL    Implants:   * No implants in log *    Drains:   Drains: Yes, 3    Specimens:   * No specimens in log *      Findings:   5.6L lipo    Complications:   none      Signed by: Park Breed, MD                                                                           [facility] SURGERY OR

## 2020-04-06 NOTE — Discharge Instr - AVS First Page (Addendum)
Reason for your Hospital Admission:  Body contouring      Instructions for after your discharge:  Please keep your garment on at all times. If you notice a crease on your garment in the stomach area, you need to place your abdominal board inside.    Favor a flexed position when at rest, head elevated, knees bent  Do take gentle walks in your home today and going forward to prevent blood clots    Take good care of your drains, otherwise you will get a seroma    Make sure you drains tubes are not kinked    Keep your drain bulbs compressed at all times and do not put them anywhere tight    Empty your drains any time they are half full    Stay bent at the waist at all times. Do not lay flat. Do not stand straight. Otherwise you will have a wound.  Walk bent over    You will be lightheaded and in pain. This quickly improves by day 3-7. Do not overexert yourself    If you feel nauseated, lay down. It is one of the first signs that you may pass out.    Stay hydrated with gatorade or pedialyte.    Please call my cell [phone] if you have any questions    OXYCODONE 5MG  PAIN PILL GIVEN AT 1400 (2:00 pm) IN RECOVERY ROOM.  You had a 2mg  valium tablet early this morning, around 5 am at home.    REMEMBER YOU HAVE A SCOPOLAMINE PATCH ON BEHIND YOUR EAR FOR NAUSEA.  It is effective 24 hours ( Tuesday afternoon) up until 72 hours (Thursday afternoon).  When you take it off, fold it sticky side to sticky side, throw in trash and wash hands.  It does cause DRY MOUTH sensation; stay hydrated.  More information below.       Caring  For Your Jackson-Pratt (JP) Drain     A Jackson-Pratt (JP) Drain is used to help empty excess fluid from the body after surgery. Use of a drain can help in the healing process.   The following instructions will help you care for your drain following your procedure.     How To Empty Your [facility] and dry your hands before emptying the drain.   Unplug the stopper on top of the bulb. Turn the  bulb upside down and squeeze  contents into the measuring cup. Record the amount of fluid drained as needed   (usually 2 times per day). Include the date and time it was emptied.   Use an alcohol swab to wipe the stopper and bulb opening clean.   Squeeze the bulb until all the air is out and then replace the stopper. This will               create the suction necessary to remove the fluid from your body. The bulb  should remain compressed at all times unless emptying.   Once the bulb is closed and secured, wash your hands again.     Helpful Tips For Your Drain   When showering, try not to let your drain(s) dangle. Try looping your drain(s) around a belt, long necklace, or lanyard.   You may have a clear dressing covering the skin where the drain is placed. It is ok to shower with it on. If there is no dressing, ask your doctor if it is ok to apply antibiotic ointment  to the site prior to showering.   There may be red stringy material in the drainage - do not worry! This material does, however, tend to block the tubing. As a result, you may need to strip the tubing:  ?      ? With one hand, hold the tubing where it leaves the skin.   ? With the other hand, use either alcohol swabs (wrap the wipe around the tubing) or put lotion on your fingertips (to facilitate a smooth glide) and pinch the tubing.  ? Slowly and firmly pull your fingers down the tubing to flatten. This will move the stringy material down the tube.    JP Drain instructions video:    [email or web address].[email or web address]    Anesthesia: After Your Surgery  Youve just had surgery. During surgery, you received medication called anesthesia to keep you comfortable and pain-free. After surgery, you may experience some pain or nausea. This is normal. Here are some tips for feeling better and recovering after surgery.     Stay on schedule with your medication.   Going Home  Your doctor or nurse will show you how to take care of yourself when you go home. He or  she will also answer your questions. Have an adult family member or friend drive you home. For the first 24 hours after your surgery:   Do not drive or use heavy equipment.   Do not make important decisions or sign documents.   Avoid alcohol.   Have someone stay with you, if possible. They can watch for problems and help keep you safe.  Be sure to keep all follow-up doctors appointments. And rest after your procedure for as long as your doctor tells you to.    Coping with Pain  If you have pain after surgery, pain medication will help you feel better. Take it as directed, before pain becomes severe. Also, ask your doctor or pharmacist about other ways to control pain, such as with heat, ice, and relaxation. And follow any other instructions your surgeon or nurse gives you.    Tips for Taking Pain Medication  To get the best relief possible, remember these points:   Pain medications can upset your stomach. Taking them with a little food may help.   Most pain relievers taken by mouth need at least 20 to 30 minutes to take effect.   Taking medication on a schedule can help you remember to take it. Try to time your medication so that you can take it before beginning an activity, such as dressing, walking, or sitting down for dinner.   Constipation is a common side effect of pain medications. Drink lots of fluids. Eating fruit and vegetables can also help. Dont take laxatives unless your surgeon has prescribed them.   Mixing alcohol and pain medication can cause dizziness and slow your breathing. It can even be fatal. Dont drink alcohol while taking pain medication.   Pain medication can slow your reflexes. Dont drive or operate machinery while taking pain medication,    Managing Nausea  Some people have an upset stomach after surgery. This is often due to anesthesia, pain, pain medications, or the stress of surgery. The following tips will help you manage nausea and get good nutrition as you recover. If  you were on a special diet before surgery, ask your doctor if you should follow it during recovery. These tips may help:   Dont push yourself to eat. Your body will tell you what to eat  and when.   Start off with liquids and soup. They are easier to digest.   Progress to semisolids (mashed potatoes, applesauce, and gelatin) as you feel ready.   Slowly move to solid foods. Dont eat fatty, rich, or spicy foods at first.   Dont force yourself to have three large meals a day. Instead, eat smaller amounts more often.   Take pain medications with a small amount of solid food, such as crackers or toast.    Call Your Surgeon If   You still have pain an hour after taking medication (it may not be strong enough).   You feel too sleepy, dizzy, or groggy (medication may be too strong).   You have side effects like nausea, vomiting, or skin changes (rash, itching, or hives).   Signs of infection - fever over 101, chills. Incision site redness, warmth, swelling, foul odor or purulent drainage.   Persistent bleeding at the operative site.    [address], [address], [place], [place] [address]. All rights reserved. This information is not intended as a substitute for professional medical care. Always follow your healthcare professional's instructions.

## 2020-04-06 NOTE — Anesthesia Preprocedure Evaluation (Signed)
Anesthesia Evaluation    AIRWAY    Mallampati: II    TM distance: >3 FB  Neck ROM: full  Mouth Opening:full   CARDIOVASCULAR    cardiovascular exam normal, regular and normal       DENTAL    no notable dental hx     PULMONARY    pulmonary exam normal and clear to auscultation     OTHER FINDINGS                  Relevant Problems   No relevant active problems               Anesthesia Plan    ASA 2     general                     intravenous induction   Detailed anesthesia plan: general endotracheal  Monitors/Adjuncts: other    Post Op: other plan for postoperative opioid use    Post op pain management: per surgeon    informed consent obtained    Plan discussed with CRNA.            ===============================================================  Inpatient Anesthesia Evaluation    Patient Name: Sara Ewing,Sara Ewing  Surgeon: Park Breed, MD  Patient Age / Sex: 29 y.o. / female    Medical History:     Past Medical History:   Diagnosis Date    Eczema        History reviewed. No pertinent surgical history.      Allergies:   No Known Allergies      Medications:     Current Facility-Administered Medications   Medication Dose Route Frequency Last Rate Last Admin    lactated ringers infusion   Intravenous Continuous 20 mL/hr at 04/06/20 0651 1,000 mL at 04/06/20 0651    scopolamine (TRANSDERM-SCOP) patch (1.5 mg) 1 mg/72 hrs 1 patch  1 patch Transdermal Once   1 patch at 04/06/20 1610              Prior to Admission medications    Medication Sig Start Date End Date Taking? Authorizing Provider   diazePAM (VALIUM) 2 MG tablet Take 2 mg by mouth as needed Per Dr instructions.   Yes [provider]   iron-vitamin C (VITRON C) 65-125 MG Tab Take by mouth   Yes [provider]     Vitals   Temp:  [36.7 C (98.1 F)] 36.7 C (98.1 F)  Heart Rate:  [69] 69  Resp Rate:  [18] 18  BP: (119)/(69) 119/69    Wt Readings from Last 3 Encounters:   04/06/20 77.1 kg (170 lb)     BMI (Estimated body mass index is  29.18 kg/m as calculated from the following:    Height as of this encounter: 1.626 m (5\' 4" ).    Weight as of this encounter: 77.1 kg (170 lb).)  Temp Readings from Last 3 Encounters:   04/06/20 36.7 C (98.1 F) (Temporal)     BP Readings from Last 3 Encounters:   04/06/20 119/69     Pulse Readings from Last 3 Encounters:   04/06/20 69           Labs:   CBC:  No results found for: WBC, HGB, HCT, PLT    Chemistries:  No results found for: NA, K, CL, CO2, BUN, CREAT, GLU, HGBA1C, CA, AST    Coags:  No results found for: PT, PTT, INR  _____________________  Signed by: Florestine Avers  04/06/20   6:58 AM    =============================================================           Signed by: Florestine Avers 04/06/20 6:57 AM

## 2020-04-06 NOTE — Anesthesia Postprocedure Evaluation (Signed)
Anesthesia Post Evaluation    Patient: Sara Ewing    Procedure(s) with comments:  ABDOMINOPLASTY, LIPOSUCTION (COSMETIC) - CIRCUMFERENTIAL TORSO LIPOSUCTION , ARM AND INNER THIGHS LIPOSUCTION, MINI TUMMYTUCK COSMETIC  LIPOSUCTION, ARM (COSMETIC)    Anesthesia type: general    Last Vitals:   Vitals Value Taken Time   BP 112/61 04/06/20 1355   Temp 36.3 C (97.4 F) 04/06/20 1355   Pulse 86 04/06/20 1355   Resp 12 04/06/20 1355   SpO2 100 % 04/06/20 1355                 Anesthesia Post Evaluation:     Patient Evaluated: PACU  Patient Participation: complete - patient participated  Level of Consciousness: awake and alert  Pain Score: 5  Pain Management: adequate    Airway Patency: patent    Anesthetic complications: No      PONV Status: none    Cardiovascular status: acceptable  Respiratory status: acceptable  Hydration status: acceptable        Signed by: Florestine Avers, 04/06/2020 2:01 PM

## 2020-04-06 NOTE — Transfer of Care (Signed)
Anesthesia Transfer of Care Note    Patient: Sara Ewing    Procedures performed: Procedure(s) with comments:  ABDOMINOPLASTY, LIPOSUCTION (COSMETIC) - CIRCUMFERENTIAL TORSO LIPOSUCTION , ARM AND INNER THIGHS LIPOSUCTION, MINI TUMMYTUCK COSMETIC  LIPOSUCTION, ARM (COSMETIC)    Anesthesia type: General ETT    Patient location:Phase I PACU    Last vitals:   Vitals:    04/06/20 1355   BP: 112/61   Pulse: 86   Resp: 12   Temp: 36.3 C (97.4 F)   SpO2: 100%       Post pain: Patient not complaining of pain, continue current therapy      Mental Status:awake    Respiratory Function: tolerating face mask    Cardiovascular: stable    Nausea/Vomiting: patient not complaining of nausea or vomiting    Hydration Status: adequate    Post assessment: no apparent anesthetic complications    Signed by: Windy Fast Thornton Dohrmann  04/06/20 1:55 PM

## 2020-04-07 NOTE — Op Note (Signed)
Procedure Date: 04/06/2020     Patient Type: A     SURGEON: Park Breed MD  ASSISTANT:       PREOPERATIVE DIAGNOSIS:  Desires body contouring.     POSTOPERATIVE DIAGNOSIS:  Desires body contouring.     TITLE OF PROCEDURES:  1.  Bilateral arm liposuction.  2.  Torso liposuction.  3.  Extended abdominoplasty.     ANESTHESIA:  General.     COMPLICATIONS:  None.     ESTIMATED BLOOD LOSS:  100 mL.     IMPLANTS:  None.     DRAINS:  Three.     DESCRIPTION OF PROCEDURE:  The patient was brought to the operative suite.  She underwent general  anesthesia and she was turned prone.  A Superwet liposuction of the back  was performed, liposuction of the arms was performed as well.  The patient  was then turned supine.  A Superwet liposuction of the abdomen was  performed.  In total 5.6 liters was aspirated.  A low transverse incision  was made.  The skin was elevated from the rectus muscles.  Plication was  performed in 2 layers.  The skin was advanced inferiorly.  The  infraumbilical skin was excised and 2 drains were placed.  The incision was  closed with 0 Vicryl and 3-0 Monocryl suture.  The umbilicus was brought  through a new incision and inset.  The patient was then placed in a  compression garment and awakened from general anesthesia without any  issues.           D:  04/07/2020 07:26 AM by Dr. Park Breed, MD ([id number])  T:  04/07/2020 10:42 AM by NTS      (Conf: [id number]) (Doc ID: [id number])

## 2022-01-10 DIAGNOSIS — Z419 Encounter for procedure for purposes other than remedying health state, unspecified: Secondary | ICD-10-CM | POA: Diagnosis not present

## 2022-01-19 ENCOUNTER — Inpatient Hospital Stay (HOSPITAL_COMMUNITY): Payer: BC Managed Care – PPO

## 2022-01-19 ENCOUNTER — Encounter (HOSPITAL_COMMUNITY): Payer: Self-pay | Admitting: *Deleted

## 2022-01-19 ENCOUNTER — Inpatient Hospital Stay (HOSPITAL_COMMUNITY)
Admission: AD | Admit: 2022-01-19 | Discharge: 2022-01-19 | Disposition: A | Discharge status: Home / Self Care | Payer: BC Managed Care – PPO | Attending: Obstetrics and Gynecology | Admitting: Obstetrics and Gynecology

## 2022-01-19 DIAGNOSIS — O23591 Infection of other part of genital tract in pregnancy, first trimester: Secondary | ICD-10-CM | POA: Diagnosis not present

## 2022-01-19 DIAGNOSIS — Z3A09 9 weeks gestation of pregnancy: Secondary | ICD-10-CM | POA: Diagnosis not present

## 2022-01-19 DIAGNOSIS — O209 Hemorrhage in early pregnancy, unspecified: Secondary | ICD-10-CM | POA: Diagnosis not present

## 2022-01-19 DIAGNOSIS — B9689 Other specified bacterial agents as the cause of diseases classified elsewhere: Secondary | ICD-10-CM | POA: Insufficient documentation

## 2022-01-19 DIAGNOSIS — Z3A08 8 weeks gestation of pregnancy: Secondary | ICD-10-CM | POA: Diagnosis not present

## 2022-01-19 DIAGNOSIS — O219 Vomiting of pregnancy, unspecified: Secondary | ICD-10-CM | POA: Insufficient documentation

## 2022-01-19 DIAGNOSIS — R519 Headache, unspecified: Secondary | ICD-10-CM | POA: Insufficient documentation

## 2022-01-19 DIAGNOSIS — N939 Abnormal uterine and vaginal bleeding, unspecified: Secondary | ICD-10-CM | POA: Diagnosis not present

## 2022-01-19 DIAGNOSIS — O26891 Other specified pregnancy related conditions, first trimester: Secondary | ICD-10-CM | POA: Diagnosis not present

## 2022-01-19 LAB — CBC
HCT: 35.4 % — ABNORMAL LOW (ref 36.0–46.0)
Hemoglobin: 11.7 g/dL — ABNORMAL LOW (ref 12.0–15.0)
MCH: 28.7 pg (ref 26.0–34.0)
MCHC: 33.1 g/dL (ref 30.0–36.0)
MCV: 87 fL (ref 80.0–100.0)
Platelets: 302 10*3/uL (ref 150–400)
RBC: 4.07 MIL/uL (ref 3.87–5.11)
RDW: 13.4 % (ref 11.5–15.5)
WBC: 14.8 10*3/uL — ABNORMAL HIGH (ref 4.0–10.5)
nRBC: 0 % (ref 0.0–0.2)

## 2022-01-19 LAB — WET PREP, GENITAL
Sperm: NONE SEEN
Trich, Wet Prep: NONE SEEN
WBC, Wet Prep HPF POC: >=10 — AB (ref <10)
Yeast Wet Prep HPF POC: NONE SEEN

## 2022-01-19 LAB — URINALYSIS, ROUTINE W REFLEX MICROSCOPIC
Bacteria, UA: NONE SEEN
Bilirubin Urine: NEGATIVE
Glucose, UA: NEGATIVE mg/dL
Hgb urine dipstick: NEGATIVE
Ketones, ur: NEGATIVE mg/dL
Nitrite: NEGATIVE
Protein, ur: NEGATIVE mg/dL
Specific Gravity, Urine: 1.023 (ref 1.005–1.030)
pH: 6 (ref 5.0–8.0)

## 2022-01-19 LAB — HCG, QUANTITATIVE, PREGNANCY: hCG, Beta Chain, Quant, S: 241434 m[IU]/mL — ABNORMAL HIGH (ref <5)

## 2022-01-19 LAB — POCT PREGNANCY, URINE: Preg Test, Ur: POSITIVE — AB

## 2022-01-19 MED ORDER — METRONIDAZOLE 0.75 % VA GEL: 1.0000 | Freq: Every day | VAGINAL | 1 refills | Status: DC

## 2022-01-19 MED ORDER — ACETAMINOPHEN 500 MG PO TABS
1000.0000 mg | ORAL_TABLET | Freq: Once | ORAL | Status: AC
  Administered 2022-01-19: 1000 mg via ORAL
  Filled 2022-01-19: qty 2

## 2022-01-19 NOTE — MAU Provider Note (Signed)
?History  ?  ? ?CSN: [id number] ? ?Arrival date and time: 01/19/22 1100 ? ? None  ?  ? ?Chief Complaint  ?Patient presents with  ? Vaginal Bleeding  ? Headache  ? Dizziness  ? Emesis  ? ?HPI ?Cheryl Benson is a 31 y.o. G2P1001 at [redacted]w[redacted]d who presents to MAU for vaginal bleeding and vomiting. Patient reports she went to the bathroom this morning and noticed red spotting only with wiping. Has had some vaginal discharge with odor. No itching/irritation. She also reports vomiting that started last night. Reports 4-5 episodes of vomiting. She has a prescription for Promethazine but has not taken "because I haven't had any morning sickness during the pregnancy". She denies sick contacts, fever, or diarrhea. She denies abdominal pain. Reports a headache, but has not taken anything.  ? ?She receives [facility] and had an ultrasound on 5/4 and was told "the baby had a normal heartbeat but was measuring small".  ? ?OB History   ? ? Gravida  ?3  ? Para  ?2  ? Term  ?2  ? Preterm  ?   ? AB  ?   ? Living  ?1  ?  ? ? SAB  ?   ? IAB  ?   ? Ectopic  ?   ? Multiple  ?   ? Live Births  ?1  ?   ?  ?  ? ? ?Past Medical History:  ?Diagnosis Date  ? Anemia   ? Trichomonas   ? ? ?No past surgical history on file. ? ?History reviewed. No pertinent family history. ? ?Social History  ? ?Tobacco Use  ? Smoking status: Never  ? Smokeless tobacco: Never  ?Substance Use Topics  ? Alcohol use: No  ? Drug use: No  ? ? ?Allergies: No Known Allergies ? ?Medications Prior to Admission  ?Medication Sig Dispense Refill Last Dose  ? promethazine (PHENERGAN) 25 MG tablet Take 25 mg by mouth every 6 (six) hours as needed for nausea or vomiting.     ? ibuprofen (ADVIL,MOTRIN) 600 MG tablet Take 1 tablet (600 mg total) by mouth every 6 (six) hours. 30 tablet 5   ? Prenatal Vit-Fe Fumarate-FA (PRENATAL MULTIVITAMIN) TABS Take 1 tablet by mouth daily.     ? ? ?Review of Systems  ?Constitutional: Negative.   ?Respiratory: Negative.    ?Cardiovascular: Negative.    ?Gastrointestinal:  Positive for nausea and vomiting. Negative for diarrhea.  ?Genitourinary:  Positive for vaginal bleeding (spotting). Negative for dysuria.  ?Musculoskeletal: Negative.   ?Neurological:  Positive for headaches.  ?Physical Exam  ? ?Blood pressure 100/64, pulse 92, temperature 99.2 ?F (37.3 ?C), temperature source Oral, resp. rate 18, height 5\' 4"  (1.626 m), weight 70.9 kg, SpO2 100 %, unknown if currently breastfeeding. ? ?Physical Exam ?Vitals and nursing note reviewed.  ?Constitutional:   ?   General: She is not in acute distress. ?Eyes:  ?   Pupils: Pupils are equal, round, and reactive to light.  ?Cardiovascular:  ?   Rate and Rhythm: Normal rate.  ?Pulmonary:  ?   Effort: Pulmonary effort is normal.  ?Abdominal:  ?   Palpations: Abdomen is soft.  ?   Tenderness: There is no abdominal tenderness.  ?Genitourinary: ?   Comments: Patient self-swabbed ?Musculoskeletal:     ?   General: Normal range of motion.  ?Skin: ?   General: Skin is warm.  ?Neurological:  ?   General: No focal deficit present.  ?  Mental Status: She is alert and oriented to person, place, and time.  ?Psychiatric:     ?   Mood and Affect: Mood normal.     ?   Behavior: Behavior normal.  ? ?US OB LESS THAN 14 WEEKS WITH OB TRANSVAGINAL ? ?Result Date: 01/19/2022 ?CLINICAL DATA:  Vaginal bleeding. EXAM: OBSTETRIC <14 WK [place] AND TRANSVAGINAL OB US TECHNIQUE: Both transabdominal and transvaginal ultrasound examinations were performed for complete evaluation of the gestation as well as the maternal uterus, adnexal regions, and pelvic cul-de-sac. Transvaginal technique was performed to assess early pregnancy. COMPARISON:  None Available. FINDINGS: Intrauterine gestational sac: Single Yolk sac:  Visualized. Embryo:  Visualized. Cardiac Activity: Visualized. Heart Rate: 169 bpm CRL: 20.8 mm 8 w 4 d [place] EDC: August 27, 2022. Subchorionic hemorrhage:  None visualized. Maternal uterus/adnexae: Ovaries are unremarkable. No free fluid is  noted. IMPRESSION: Single live intrauterine gestation of 8 weeks 4 days. Electronically Signed   By: Lupita Raider M.D.   On: 01/19/2022 13:48   ? ?MAU Course  ?Procedures ? ?MDM ?UA negative ?Unable to access patient's labs and ultrasound from 5/4 so will draw CBC and HCG as well as order [place] ?Patient's blood type is O pos, Rhogam not indicated ?Wet prep +clue cells, GC/CT pending ?Patient offered nausea medications but declines as she is no longer feeling nauseous. 1g Tylenol PO for headache per patient request. Headache improved. Tolerated ginger ale and crackers. US shows IUP with FHR present.  ? ?Assessment and Plan  ?[redacted] weeks gestation of pregnancy ?Vaginal bleeding affecting pregnancy ?Bacterial vaginosis  ? ?- Stable for discharge home ?- Use Promethazine as prescribed for nausea. May use Tylenol prn for headaches ?- Will send RX for Metrogel to pharmacy ?- Keep OB appointment as scheduled on 6/1 at [facility] ?- Return to MAU sooner for worsening symptoms  ? ? ? ?Brand Males, CNM ?01/19/2022, 2:02 PM  ?

## 2022-01-19 NOTE — MAU Note (Signed)
Cheryl Benson is a 31 y.o.  here in MAU reporting: had some bleeding this morning.  Red, put on a pantiliner. Started vomiting this  last night. Can't keep anything down. Now has a HA and feels dizzy at times. No one else at home is sick.  Has not been having morning sickness. Was seen 5/4 in office.  Had an [place], measuring small - but had a heartbeat. 12/11 ?Onset of complaint: last night ?Pain score: 8- did not take anything ?Vitals:  ? 01/19/22 1131  ?BP: 108/66  ?Pulse: 95  ?Resp: 18  ?Temp: 99.2 ?F (37.3 ?C)  ?SpO2: 100%  ?   ? ?Lab orders placed from triage:  upt, ua ?

## 2022-01-20 LAB — GC/CHLAMYDIA PROBE AMP (~~LOC~~) NOT AT ARMC
Chlamydia: NEGATIVE
Comment: NEGATIVE
Comment: NORMAL
Neisseria Gonorrhea: NEGATIVE

## 2022-02-08 ENCOUNTER — Inpatient Hospital Stay (HOSPITAL_COMMUNITY): Payer: BC Managed Care – PPO

## 2022-02-08 ENCOUNTER — Inpatient Hospital Stay (HOSPITAL_COMMUNITY)
Admission: AD | Admit: 2022-02-08 | Discharge: 2022-02-08 | Disposition: A | Discharge status: Home / Self Care | Payer: BC Managed Care – PPO | Attending: Obstetrics & Gynecology | Admitting: Obstetrics & Gynecology

## 2022-02-08 ENCOUNTER — Other Ambulatory Visit: Payer: Self-pay

## 2022-02-08 ENCOUNTER — Encounter (HOSPITAL_COMMUNITY): Payer: Self-pay | Admitting: Obstetrics & Gynecology

## 2022-02-08 DIAGNOSIS — O209 Hemorrhage in early pregnancy, unspecified: Secondary | ICD-10-CM | POA: Diagnosis present

## 2022-02-08 DIAGNOSIS — Z3A12 12 weeks gestation of pregnancy: Secondary | ICD-10-CM | POA: Diagnosis not present

## 2022-02-08 DIAGNOSIS — R519 Headache, unspecified: Secondary | ICD-10-CM | POA: Insufficient documentation

## 2022-02-08 DIAGNOSIS — O26851 Spotting complicating pregnancy, first trimester: Secondary | ICD-10-CM | POA: Diagnosis not present

## 2022-02-08 DIAGNOSIS — Z3A11 11 weeks gestation of pregnancy: Secondary | ICD-10-CM | POA: Diagnosis not present

## 2022-02-08 DIAGNOSIS — O4690 Antepartum hemorrhage, unspecified, unspecified trimester: Secondary | ICD-10-CM

## 2022-02-08 LAB — URINALYSIS, ROUTINE W REFLEX MICROSCOPIC
Bilirubin Urine: NEGATIVE
Glucose, UA: NEGATIVE mg/dL
Ketones, ur: NEGATIVE mg/dL
Leukocytes,Ua: NEGATIVE
Nitrite: NEGATIVE
Protein, ur: NEGATIVE mg/dL
Specific Gravity, Urine: 1.016 (ref 1.005–1.030)
pH: 6 (ref 5.0–8.0)

## 2022-02-08 LAB — WET PREP, GENITAL
Clue Cells Wet Prep HPF POC: NONE SEEN
Sperm: NONE SEEN
Trich, Wet Prep: NONE SEEN
WBC, Wet Prep HPF POC: >=10 — AB (ref <10)
Yeast Wet Prep HPF POC: NONE SEEN

## 2022-02-08 NOTE — MAU Provider Note (Signed)
History     CSN: [id number]  Arrival date and time: 02/08/22 1728   Event Date/Time   First Provider Initiated Contact with Patient 02/08/22 1921      Chief Complaint  Patient presents with   Vaginal Bleeding   Ms. Cheryl Benson is a 31 y.o. year old G38P2002 female at [redacted]w[redacted]d weeks gestation who presents to MAU reporting vaginal bleeding this morning; wearing only a pantiliner. She denies passing any blood clots. She reports she came to MAU before for the same complaint. She reports her last SI was 02/07/2022. She also reports a H/A; rated 7/10. She has not taken any medication for the H/A, but asks if she could eat [employer] and get some water to drink. She receives [facility] with [facility]; next appt is next week.  OB History     Gravida  3   Para  2   Term  2   Preterm      AB      Living  2      SAB      IAB      Ectopic      Multiple      Live Births  1           Past Medical History:  Diagnosis Date   Anemia    Trichomonas     Past Surgical History:  Procedure Laterality Date   COSMETIC SURGERY      History reviewed. No pertinent family history.  Social History   Tobacco Use   Smoking status: Never   Smokeless tobacco: Never  Substance Use Topics   Alcohol use: No   Drug use: No    Allergies: No Known Allergies  Medications Prior to Admission  Medication Sig Dispense Refill Last Dose   metroNIDAZOLE (METROGEL) 0.75 % vaginal gel Place 1 Applicatorful vaginally at bedtime. Apply one applicatorful to vagina at bedtime for 5 days 70 g 1    Prenatal Vit-Fe Fumarate-FA (PRENATAL MULTIVITAMIN) TABS Take 1 tablet by mouth daily.   Unknown   promethazine (PHENERGAN) 25 MG tablet Take 25 mg by mouth every 6 (six) hours as needed for nausea or vomiting.   Unknown    Review of Systems  Constitutional: Negative.   HENT: Negative.    Eyes: Negative.   Respiratory: Negative.    Cardiovascular: Negative.   Gastrointestinal:  Negative.   Endocrine: Negative.   Genitourinary:  Positive for vaginal bleeding (small amount; wearing pantiliner).  Musculoskeletal: Negative.   Skin: Negative.   Allergic/Immunologic: Negative.   Neurological:  Positive for headaches (7/10; no meds taken).  Hematological: Negative.   Psychiatric/Behavioral: Negative.    Physical Exam   Blood pressure 109/69, pulse 99, temperature 98.8 F (37.1 C), temperature source Oral, resp. rate 19, height 5\' 3"  (1.6 m), weight 69 kg, SpO2 97 %, unknown if currently breastfeeding.  Physical Exam Vitals and nursing note reviewed. Exam conducted with a chaperone present.  Constitutional:      Appearance: Normal appearance.  HENT:     Head: Normocephalic and atraumatic.  Cardiovascular:     Rate and Rhythm: Normal rate.  Pulmonary:     Effort: Pulmonary effort is normal.  Abdominal:     General: Abdomen is flat.     Palpations: Abdomen is soft.     Comments: Scar from hip to hip and around umbilicus from prior abdominoplasty  Genitourinary:    General: Normal vulva.     Comments: Pelvic exam: External genitalia normal,  SE: vaginal walls pink and well rugated, cervix is smooth, pink, no lesions, scant amt of thick tan vaginal d/c -- WP, GC/CT done, cervix closed/thick/high, Uterus is non-tender, S=D, (+) CMT, no friability, RT adnexal tenderness. Musculoskeletal:        General: Normal range of motion.  Skin:    General: Skin is warm and dry.  Neurological:     Mental Status: She is alert and oriented to person, place, and time.  Psychiatric:        Mood and Affect: Mood normal.        Behavior: Behavior normal.        Thought Content: Thought content normal.        Judgment: Judgment normal.    FHTs by doppler: 161 bpm  MAU Course  Procedures  MDM Wet Prep GC/CT -- Results pending   Results for orders placed or performed during the hospital encounter of 02/08/22 (from the past 24 hour(s))  Urinalysis, Routine w reflex  microscopic Urine, Clean Catch     Status: Abnormal   Collection Time: 02/08/22  6:47 PM  Result Value Ref Range   Color, Urine YELLOW YELLOW   APPearance HAZY (A) CLEAR   Specific Gravity, Urine 1.016 1.005 - 1.030   pH 6.0 5.0 - 8.0   Glucose, UA NEGATIVE NEGATIVE mg/dL   Hgb urine dipstick MODERATE (A) NEGATIVE   Bilirubin Urine NEGATIVE NEGATIVE   Ketones, ur NEGATIVE NEGATIVE mg/dL   Protein, ur NEGATIVE NEGATIVE mg/dL   Nitrite NEGATIVE NEGATIVE   Leukocytes,Ua NEGATIVE NEGATIVE   RBC / HPF 0-5 0 - 5 RBC/hpf   WBC, UA 0-5 0 - 5 WBC/hpf   Bacteria, UA RARE (A) NONE SEEN   Squamous Epithelial / LPF 0-5 0 - 5   Mucus PRESENT   Wet prep, genital     Status: Abnormal   Collection Time: 02/08/22  7:30 PM  Result Value Ref Range   Yeast Wet Prep HPF POC NONE SEEN NONE SEEN   Trich, Wet Prep NONE SEEN NONE SEEN   Clue Cells Wet Prep HPF POC NONE SEEN NONE SEEN   WBC, Wet Prep HPF POC >=10 (A) <10   Sperm NONE SEEN    US OB Comp Less 14 Wks  Result Date: 02/08/2022 CLINICAL DATA:  Spotting and vaginal bleeding. Obstetrical ultrasound 01/19/2022. EXAM: OBSTETRIC <14 WK ULTRASOUND TECHNIQUE: Transabdominal ultrasound was performed for evaluation of the gestation as well as the maternal uterus and adnexal regions. COMPARISON:  None Available. FINDINGS: Intrauterine gestational sac: Single Yolk sac:  Not Visualized. Embryo:  Visualized. Cardiac Activity: Visualized. Heart Rate: 153 bpm CRL:   48.1 mm   11 w 4 d                  US EDC: 08/26/2022 Subchorionic hemorrhage:  None visualized. Maternal uterus/adnexae: Bilateral ovaries appear within normal limits. No free fluid. IMPRESSION: 1. Single live intrauterine gestation measuring 11 weeks 4 days on current ultrasound. Assigned gestational age [redacted] weeks 1 day. Electronically Signed   By: Darliss CheneyAmy  Guttmann M.D.   On: 02/08/2022 20:39     Assessment and Plan  1. Vaginal bleeding during pregnancy - Information provided on vaginal bleeding in  pregnancy - Return to MAU: If you have heavier bleeding that soaks through more that 2 pads per hour for an hour or more If you bleed so much that you feel like you might pass out or you do pass out If you have significant abdominal  pain that is not improved with Tylenol 1000 mg every 8 hours as needed for pain If you develop a fever > 100.5   2. Headache in pregnancy, antepartum, first trimester - Advised to take Tylenol 1000 mg po every 8 hours prn H/A  3. [redacted] weeks gestation of pregnancy   - Discharge patient - Keep scheduled appointment with [facility] - Patient verbalized an understanding of the plan of care and agrees.   Raelyn Mora, CNM 02/08/2022, 7:21 PM

## 2022-02-08 NOTE — Discharge Instructions (Signed)
Return to MAU: If you have heavier bleeding that soaks through more that 2 pads per hour for an hour or more If you bleed so much that you feel like you might pass out or you do pass out If you have significant abdominal pain that is not improved with Tylenol 1000 mg every 8 hours as needed for pain If you develop a fever > 100.5  

## 2022-02-08 NOTE — MAU Note (Signed)
Cheryl Benson is a 31 y.o. at [redacted]w[redacted]d here in MAU reporting: VB that began this morning, states wearing a panty liner.  Denies passing clots.  Reports last intercourse was yesterday.  Also reports currently has a H/A, hasn't taken meds for H/A  Onset of complaint: today Pain score: 7/10 Vitals:   02/08/22 1748  BP: 109/69  Pulse: 99  Resp: 19  Temp: 98.8 F (37.1 C)  SpO2: 97%     FHT:161 bpm Lab orders placed from triage:   UA

## 2022-02-09 LAB — GC/CHLAMYDIA PROBE AMP (~~LOC~~) NOT AT ARMC
Chlamydia: NEGATIVE
Comment: NEGATIVE
Comment: NORMAL
Neisseria Gonorrhea: NEGATIVE

## 2022-02-10 DIAGNOSIS — Z419 Encounter for procedure for purposes other than remedying health state, unspecified: Secondary | ICD-10-CM | POA: Diagnosis not present

## 2022-03-12 DIAGNOSIS — Z419 Encounter for procedure for purposes other than remedying health state, unspecified: Secondary | ICD-10-CM | POA: Diagnosis not present

## 2022-04-12 DIAGNOSIS — Z419 Encounter for procedure for purposes other than remedying health state, unspecified: Secondary | ICD-10-CM | POA: Diagnosis not present

## 2022-05-13 DIAGNOSIS — Z419 Encounter for procedure for purposes other than remedying health state, unspecified: Secondary | ICD-10-CM | POA: Diagnosis not present

## 2022-06-12 DIAGNOSIS — Z419 Encounter for procedure for purposes other than remedying health state, unspecified: Secondary | ICD-10-CM | POA: Diagnosis not present

## 2022-06-14 ENCOUNTER — Encounter (HOSPITAL_COMMUNITY): Payer: Self-pay | Admitting: Obstetrics and Gynecology

## 2022-06-14 ENCOUNTER — Other Ambulatory Visit: Payer: Self-pay

## 2022-06-14 ENCOUNTER — Inpatient Hospital Stay (HOSPITAL_COMMUNITY)
Admission: AD | Admit: 2022-06-14 | Discharge: 2022-06-14 | Disposition: A | Discharge status: Home / Self Care | Payer: BC Managed Care – PPO | Attending: Obstetrics and Gynecology | Admitting: Obstetrics and Gynecology

## 2022-06-14 DIAGNOSIS — R102 Pelvic and perineal pain: Secondary | ICD-10-CM | POA: Insufficient documentation

## 2022-06-14 DIAGNOSIS — O99891 Other specified diseases and conditions complicating pregnancy: Secondary | ICD-10-CM | POA: Diagnosis not present

## 2022-06-14 DIAGNOSIS — Z3A30 30 weeks gestation of pregnancy: Secondary | ICD-10-CM | POA: Diagnosis not present

## 2022-06-14 DIAGNOSIS — M549 Dorsalgia, unspecified: Secondary | ICD-10-CM | POA: Diagnosis not present

## 2022-06-14 DIAGNOSIS — M545 Low back pain, unspecified: Secondary | ICD-10-CM | POA: Diagnosis present

## 2022-06-14 DIAGNOSIS — O26893 Other specified pregnancy related conditions, third trimester: Secondary | ICD-10-CM | POA: Diagnosis not present

## 2022-06-14 DIAGNOSIS — O4693 Antepartum hemorrhage, unspecified, third trimester: Secondary | ICD-10-CM | POA: Insufficient documentation

## 2022-06-14 NOTE — Discharge Instructions (Signed)
Return to MAU: If you have heavier bleeding that soaks through more that 2 pads per hour for an hour or more If you bleed so much that you feel like you might pass out or you do pass out If you have significant abdominal pain that is not improved with Tylenol 1000 mg every 8 hours as needed for pain If you develop a fever > 100.5  

## 2022-06-14 NOTE — MAU Provider Note (Signed)
History     CSN: [id number]  Arrival date and time: 06/14/22 1831   Event Date/Time   First Provider Initiated Contact with Patient 06/14/22 2030      Chief Complaint  Patient presents with   Headache   Back Pain   Pelvic Pain   Spotting   Ms. Cheryl Benson is a 31 y.o. year old G75P2002 female at [redacted]w[redacted]d weeks gestation who presents to MAU reporting lower back and pelvic pain today. She report pink spotting that started Friday 06/10/2022, but now is brown. Last SI was Thursday 06/09/2022. She also reports a H/A all day; none now. She took Tylenol at 0900 this morning without relief. She denies any LOF. She reports good (+) FM. She receives [facility] with [facility]; next appt is 06/20/2022.   OB History     Gravida  3   Para  2   Term  2   Preterm      AB      Living  2      SAB      IAB      Ectopic      Multiple      Live Births  2           Past Medical History:  Diagnosis Date   Anemia    Trichomonas     Past Surgical History:  Procedure Laterality Date   COSMETIC SURGERY      History reviewed. No pertinent family history.  Social History   Tobacco Use   Smoking status: Never   Smokeless tobacco: Never  Substance Use Topics   Alcohol use: No   Drug use: No    Allergies: No Known Allergies  Medications Prior to Admission  Medication Sig Dispense Refill Last Dose   ferrous sulfate 325 (65 FE) MG EC tablet Take 325 mg by mouth daily with breakfast.   06/14/2022   Prenatal Vit-Fe Fumarate-FA (PRENATAL MULTIVITAMIN) TABS Take 1 tablet by mouth daily.   06/14/2022   Vitamin D, Cholecalciferol, 10 MCG (400 UNIT) TABS Take by mouth.   06/14/2022   promethazine (PHENERGAN) 25 MG tablet Take 25 mg by mouth every 6 (six) hours as needed for nausea or vomiting.       Review of Systems  Constitutional: Negative.   HENT: Negative.    Eyes: Negative.   Respiratory: Negative.    Cardiovascular:  Positive for leg swelling.   Gastrointestinal: Negative.   Endocrine: Negative.   Genitourinary:  Positive for pelvic pain. Negative for vaginal bleeding (spotting since Friday 06/10/2022, "I didn't see anymore when I went to the [facility] here.").  Musculoskeletal:  Positive for back pain.       Hands swelling  Skin: Negative.   Allergic/Immunologic: Negative.   Neurological: Negative.  Negative for headaches (earlier today, Tylenol didn't relieve, "I don't like taking medications in pregnancy; only my PNV.").  Hematological: Negative.   Psychiatric/Behavioral: Negative.     Physical Exam   Blood pressure (!) 93/52, pulse (!) 118, temperature 98.3 F (36.8 C), temperature source Oral, resp. rate 19, height 5\' 3"  (1.6 m), weight 84.5 kg, SpO2 99 %, unknown if currently breastfeeding.  Physical Exam Vitals and nursing note reviewed. Exam conducted with a chaperone present.  Constitutional:      Appearance: Normal appearance. She is normal weight.  Cardiovascular:     Rate and Rhythm: Tachycardia present.  Pulmonary:     Effort: Pulmonary effort is normal.  Abdominal:  Palpations: Abdomen is soft.  Genitourinary:    General: Normal vulva.     Vagina: Vaginal discharge (thin, white discharge at introitus) present.     Comments: Dilation: Closed Effacement (%): Thick Cervical Position: Posterior Station: Ballotable Presentation: Vertex Exam by: Carloyn Jaeger, CNM Skin:    General: Skin is warm and dry.  Neurological:     Mental Status: She is alert and oriented to person, place, and time.  Psychiatric:        Mood and Affect: Mood normal.        Behavior: Behavior normal.        Thought Content: Thought content normal.        Judgment: Judgment normal.    REACTIVE NST - FHR: 140 bpm / moderate variability / accels present / decels absent / TOCO: none  MAU Course  Procedures  MDM CEFM SVE Unable to obtain fFN d/t recent VB  Assessment and Plan  1. Back pain affecting pregnancy in third trimester -  Information provided on back pain in pregnancy   2. Pelvic pain affecting pregnancy in third trimester, antepartum - Information provided on abdominal pain in pregnancy   3. Vaginal bleeding in pregnancy, third trimester - Information provided on VB in 3rd trimester   4. [redacted] weeks gestation of pregnancy   - Discharge patient - Keep scheduled appt with [facility] on 06/20/2022 - Patient verbalized an understanding of the plan of care and agrees.   Cheryl Benson, CNM 06/14/2022, 8:31 PM

## 2022-06-14 NOTE — MAU Note (Addendum)
Cheryl Benson is a 31 y.o. at [redacted]w[redacted]d here in MAU reporting: she's has lower back and pelvic pain that began today.  Also reports brown vaginal spotting that Friday.  States spotting was initially pink, now brown.  Reports last intercourse was Thursday.   Also reports has had a HA all day, took Tylenol @ 0900 this morning, reports no relief noted. Denies LOF.  Endorses +FM. LMP: N/A Onset of complaint: Friday & today Pain score: 7-10   VS: 117/76         119          19           98.3           99 %                                     FHT:157 bpm Lab orders placed from triage:   UA

## 2022-07-13 ENCOUNTER — Inpatient Hospital Stay (HOSPITAL_COMMUNITY)
Admission: AD | Admit: 2022-07-13 | Discharge: 2022-07-13 | Disposition: A | Discharge status: Home / Self Care | Payer: BC Managed Care – PPO | Attending: Obstetrics and Gynecology | Admitting: Obstetrics and Gynecology

## 2022-07-13 ENCOUNTER — Encounter (HOSPITAL_COMMUNITY): Payer: Self-pay | Admitting: Obstetrics and Gynecology

## 2022-07-13 ENCOUNTER — Inpatient Hospital Stay (HOSPITAL_BASED_OUTPATIENT_CLINIC_OR_DEPARTMENT_OTHER): Payer: BC Managed Care – PPO

## 2022-07-13 DIAGNOSIS — Z3A34 34 weeks gestation of pregnancy: Secondary | ICD-10-CM | POA: Diagnosis not present

## 2022-07-13 DIAGNOSIS — Z3689 Encounter for other specified antenatal screening: Secondary | ICD-10-CM | POA: Diagnosis not present

## 2022-07-13 DIAGNOSIS — Z419 Encounter for procedure for purposes other than remedying health state, unspecified: Secondary | ICD-10-CM | POA: Diagnosis not present

## 2022-07-13 DIAGNOSIS — O368130 Decreased fetal movements, third trimester, not applicable or unspecified: Secondary | ICD-10-CM | POA: Diagnosis not present

## 2022-07-13 DIAGNOSIS — O4703 False labor before 37 completed weeks of gestation, third trimester: Secondary | ICD-10-CM

## 2022-07-13 DIAGNOSIS — O26893 Other specified pregnancy related conditions, third trimester: Secondary | ICD-10-CM | POA: Diagnosis not present

## 2022-07-13 DIAGNOSIS — Z3493 Encounter for supervision of normal pregnancy, unspecified, third trimester: Secondary | ICD-10-CM

## 2022-07-13 DIAGNOSIS — R109 Unspecified abdominal pain: Secondary | ICD-10-CM | POA: Diagnosis not present

## 2022-07-13 LAB — URINALYSIS, ROUTINE W REFLEX MICROSCOPIC
Bilirubin Urine: NEGATIVE
Glucose, UA: NEGATIVE mg/dL
Hgb urine dipstick: NEGATIVE
Ketones, ur: NEGATIVE mg/dL
Leukocytes,Ua: NEGATIVE
Nitrite: NEGATIVE
Protein, ur: NEGATIVE mg/dL
Specific Gravity, Urine: 1.015 (ref 1.005–1.030)
pH: 6 (ref 5.0–8.0)

## 2022-07-13 NOTE — MAU Note (Signed)
Cheryl Benson is a 31 y.o. at [redacted]w[redacted]d here in MAU reporting: been having contractions here and there, sporadic, now an hour apart. Has been having diarrhea , loose and watery for 10days, maybe 3/day.  Was told to take imodium if ir continues, hasn't taken any because a she doesn't like to take meds.  Also been having decreased FM for a wk, still feeling her at night, but not as often.  Denies bleeding or leaking.  Onset of complaint: early this morning Pain score: 6 Vitals:   07/13/22 1830  BP: 114/75  Pulse: (!) 120  Resp: 20  Temp: 98 F (36.7 C)  SpO2: 100%     FHT:145 Lab orders placed from triage:  urine  Office had called this morning.

## 2022-07-13 NOTE — MAU Provider Note (Addendum)
History     CSN: [id number]  Arrival date and time: 07/13/22 1757   Event Date/Time   First Provider Initiated Contact with Patient 07/13/22 1930      Chief Complaint  Patient presents with   Decreased Fetal Movement   Abdominal Pain   Contractions   HPI  Cheryl Benson is a 31 y.o. female G3P2002 @ [redacted]w[redacted]d here with decreased fetal movement. She reports changes in the movement pattern over the last 2 weeks. She reports good fetal movement now and at night. No history of preterm labor or preterm delivery with previous pregnancies. No diabetes or HTN. No bleeding.  She reports some mild contractions. Has had braxton hicks contractions throughout the pregnancy.   Hx of Tummy tuck and muscle repair.   OB History     Gravida  3   Para  2   Term  2   Preterm      AB      Living  2      SAB      IAB      Ectopic      Multiple      Live Births  2           Past Medical History:  Diagnosis Date   Anemia    Trichomonas     Past Surgical History:  Procedure Laterality Date   COSMETIC SURGERY      History reviewed. No pertinent family history.  Social History   Tobacco Use   Smoking status: Never   Smokeless tobacco: Never  Vaping Use   Vaping Use: Never used  Substance Use Topics   Alcohol use: No   Drug use: No    Allergies: No Known Allergies  Medications Prior to Admission  Medication Sig Dispense Refill Last Dose   Prenatal Vit-Fe Fumarate-FA (PRENATAL MULTIVITAMIN) TABS Take 1 tablet by mouth daily.   07/13/2022   ferrous sulfate 325 (65 FE) MG EC tablet Take 325 mg by mouth daily with breakfast.      promethazine (PHENERGAN) 25 MG tablet Take 25 mg by mouth every 6 (six) hours as needed for nausea or vomiting.      Vitamin D, Cholecalciferol, 10 MCG (400 UNIT) TABS Take by mouth.      Results for orders placed or performed during the hospital encounter of 07/13/22 (from the past 48 hour(s))  Urinalysis, Routine w reflex microscopic  Urine, Clean Catch     Status: None   Collection Time: 07/13/22  6:38 PM  Result Value Ref Range   Color, Urine YELLOW YELLOW   APPearance CLEAR CLEAR   Specific Gravity, Urine 1.015 1.005 - 1.030   pH 6.0 5.0 - 8.0   Glucose, UA NEGATIVE NEGATIVE mg/dL   Hgb urine dipstick NEGATIVE NEGATIVE   Bilirubin Urine NEGATIVE NEGATIVE   Ketones, ur NEGATIVE NEGATIVE mg/dL   Protein, ur NEGATIVE NEGATIVE mg/dL   Nitrite NEGATIVE NEGATIVE   Leukocytes,Ua NEGATIVE NEGATIVE    Comment: Performed at [facility] [address], [place], [place] [address]    Review of Systems  Constitutional:  Negative for fever.  Gastrointestinal:  Positive for abdominal pain.  Genitourinary:  Negative for dysuria.   Physical Exam   Blood pressure 103/70, pulse (!) 141, temperature 98 F (36.7 C), temperature source Oral, resp. rate 20, height 5\' 3"  (1.6 m), weight 87.4 kg, SpO2 99 %, unknown if currently breastfeeding.  Physical Exam Constitutional:      General: She is  not in acute distress.    Appearance: She is well-developed. She is not ill-appearing, toxic-appearing or diaphoretic.  HENT:     Head: Normocephalic.  Abdominal:     Tenderness: There is no abdominal tenderness.  Genitourinary:    Comments: Cervix closed, thick, posterior  Exam by Cheryl Carbon, NP  Skin:    General: Skin is warm.  Neurological:     Mental Status: She is alert and oriented to person, place, and time.    Fetal Tracing: Baseline: 145 bpm, initially with minimal variability  Variability:minimal- moderate  Accelerations: 10x10 Decelerations: None  Toco: None  MAU Course  Procedures None  MDM  Cervix is long and closed BPP ordered. Report given to Cheryl Benson CNM who resumes care of the patient. She is waiting for BPP   Rasch, Cheryl Rutherford, NP  PreLim BPP results 8/8. With reactive NST 10/10.  Patient reassured of findings, plan for discharge.   Assessment and Plan   1. Movement of fetus present  during pregnancy in third trimester   2. [redacted] weeks gestation of pregnancy   3. NST (non-stress test) reactive    - Discussed findings of BPP results with patient. Patient stated feeling fetal movement. Reviewed fetal kick counts with patient.  - Worsening sings and return precautions reviewed.  - FHT Reactive and Reassuring with BPP results upon discharge.  - Preterm labor precautions reviewed  - Patient discharged home in stable condition and may return to MAU as needed.   Cheryl Benson Danella Deis) Suzie Portela, MSN, CNM  [facility]  07/14/22 2:48 AM

## 2022-07-15 ENCOUNTER — Encounter (HOSPITAL_COMMUNITY): Payer: Self-pay | Admitting: Obstetrics & Gynecology

## 2022-07-15 ENCOUNTER — Inpatient Hospital Stay (HOSPITAL_COMMUNITY)
Admission: AD | Admit: 2022-07-15 | Discharge: 2022-07-15 | Disposition: A | Discharge status: Other Acute Inpatient Hospital | Payer: BC Managed Care – PPO | Attending: Obstetrics & Gynecology | Admitting: Obstetrics & Gynecology

## 2022-07-15 DIAGNOSIS — Z3A34 34 weeks gestation of pregnancy: Secondary | ICD-10-CM | POA: Diagnosis not present

## 2022-07-15 DIAGNOSIS — O4703 False labor before 37 completed weeks of gestation, third trimester: Secondary | ICD-10-CM | POA: Insufficient documentation

## 2022-07-15 LAB — URINALYSIS, ROUTINE W REFLEX MICROSCOPIC
Bilirubin Urine: NEGATIVE
Glucose, UA: NEGATIVE mg/dL
Hgb urine dipstick: NEGATIVE
Ketones, ur: NEGATIVE mg/dL
Leukocytes,Ua: NEGATIVE
Nitrite: NEGATIVE
Protein, ur: NEGATIVE mg/dL
Specific Gravity, Urine: 1.018 (ref 1.005–1.030)
pH: 6 (ref 5.0–8.0)

## 2022-07-15 MED ORDER — LACTATED RINGERS IV SOLN: INTRAVENOUS | Status: DC

## 2022-07-15 MED ORDER — PENICILLIN G POTASSIUM 5000000 UNITS IJ SOLR: 5.0000 10*6.[IU] | INTRAMUSCULAR | Status: DC

## 2022-07-15 MED ORDER — BETAMETHASONE SOD PHOS & ACET 6 (3-3) MG/ML IJ SUSP
12.0000 mg | INTRAMUSCULAR | Status: DC
  Administered 2022-07-15: 12 mg via INTRAMUSCULAR
  Filled 2022-07-15: qty 5

## 2022-07-15 MED ORDER — NIFEDIPINE 10 MG PO CAPS
10.0000 mg | ORAL_CAPSULE | ORAL | Status: DC | PRN
  Filled 2022-07-15: qty 1

## 2022-07-15 MED ORDER — PENICILLIN G POT IN DEXTROSE 60000 UNIT/ML IV SOLN
3.0000 10*6.[IU] | INTRAVENOUS | Status: DC
  Filled 2022-07-15 (×2): qty 50

## 2022-07-15 MED ORDER — SODIUM CHLORIDE 0.9 % IV SOLN
5.0000 10*6.[IU] | Freq: Once | INTRAVENOUS | Status: AC
  Administered 2022-07-15: 5 10*6.[IU] via INTRAVENOUS
  Filled 2022-07-15: qty 5

## 2022-07-15 MED ORDER — LACTATED RINGERS IV BOLUS
1000.0000 mL | Freq: Once | INTRAVENOUS | Status: AC
  Administered 2022-07-15: 1000 mL via INTRAVENOUS

## 2022-07-15 NOTE — MAU Note (Signed)
..  Cheryl Benson is a 31 y.o. at [redacted]w[redacted]d here in MAU reporting: contractions all day that ar every 5-10 minutes. Denies vaginal bleeding or leaking of fluid. Has not felt baby move in the last hour. Reports diarrhea all day.   Pain score: 9/10  FHT:155

## 2022-07-15 NOTE — MAU Provider Note (Addendum)
History     CSN: [id number]  Arrival date and time: 07/15/22 0000   Event Date/Time   First Provider Initiated Contact with Patient 07/15/22 0039      Chief Complaint  Patient presents with   Contractions   HPI 31 y/o G4P2012 at 34 weeks 4 days EGA with LMP 11/15/21 EDC 08/22/2022 (EDC by LMP which was consistent with first trimester ultrasound) here complaining of contractions.  Her cervix was closed initially and progressed to 1.5 cm/60%/-2.  Patient declines any intervention to stop preterm labor. Out NICU is closed and therefore patient will be transferred out to [facility].   Prenatal course has been significant for GBS bacteriuria and patient is s/p oral treatment.   OB History     Gravida  3   Para  2   Term  2   Preterm      AB      Living  2      SAB      IAB      Ectopic      Multiple      Live Births  2           Past Medical History:  Diagnosis Date   Anemia    Trichomonas     Past Surgical History:  Procedure Laterality Date   COSMETIC SURGERY      History reviewed. No pertinent family history.  Social History   Tobacco Use   Smoking status: Never   Smokeless tobacco: Never  Vaping Use   Vaping Use: Never used  Substance Use Topics   Alcohol use: No   Drug use: No    Allergies: No Known Allergies  Medications Prior to Admission  Medication Sig Dispense Refill Last Dose   Prenatal Vit-Fe Fumarate-FA (PRENATAL MULTIVITAMIN) TABS Take 1 tablet by mouth daily.   07/14/2022   ferrous sulfate 325 (65 FE) MG EC tablet Take 325 mg by mouth daily with breakfast.      promethazine (PHENERGAN) 25 MG tablet Take 25 mg by mouth every 6 (six) hours as needed for nausea or vomiting.      Vitamin D, Cholecalciferol, 10 MCG (400 UNIT) TABS Take by mouth.       Review of Systems Constitutional: Denies fevers/chills Cardiovascular: Denies chest pain or palpitations Pulmonary: Denies coughing or  wheezing Gastrointestinal: Denies nausea, vomiting or diarrhea Genitourinary: Denies unusual vaginal bleeding, unusual vaginal discharge, dysuria, urgency or frequency.  Musculoskeletal: Denies muscle or joint aches and pain.  Neurology: Denies abnormal sensations such as tingling or numbness.   Physical Exam   Blood pressure 117/72, pulse (!) 123, SpO2 99 %, unknown if currently breastfeeding.  Constitutional: She is oriented to person, place, and time. She appears well-developed and well-nourished.  HENT:  Head: Normocephalic and atraumatic.  Neck: Normal range of motion.  Cardiovascular: Normal rate, regular rhythm and normal heart sounds.   Respiratory: Effort normal and breath sounds normal.  GI: Soft. Bowel sounds are normal. Genitorurinary: Gravid uterus, appropriate for gestational age.   Neurological: She is alert and oriented to person, place, and time.  Skin: Skin is warm and dry.  Psychiatric: She has a normal mood and affect. Her behavior is normal.    NST: 130 baseline, moderate variability, reactive. TOCO: Contractions every 2 to 3 minutes. CVX: 1.5/60%/-2. Cephalic presentation.   Assessment and Plan  31 y/o [id number] at 34 weeks 4 days in early preterm labor, - Transfer to [facility]. -  Discussed with patient risks, benefits and alternatives of transfer to [facility] including but not limited to risks of delivery en route.   Archie Endo, MD. 07/15/2022, 4:48 AM

## 2022-07-15 NOTE — MAU Provider Note (Addendum)
Chief Complaint:  Contractions   Event Date/Time   First Provider Initiated Contact with Patient 07/15/22 0039     HPI: Cheryl Benson is a 31 y.o. G3P2002 at 22w4dwho presents to maternity admissions reporting painfui contractions since yesterday. Has not reported this to her providers.  Reports decreased fetal movmement. . She denies LOF, vaginal bleeding, vaginal itching/burning, urinary symptoms, h/a, dizziness, n/v, diarrhea, constipation or fever/chills.   Abdominal Pain This is a new problem. The current episode started yesterday. The onset quality is gradual. The problem occurs intermittently. The quality of the pain is cramping. The abdominal pain does not radiate. Pertinent negatives include no diarrhea, fever, frequency or myalgias. Nothing aggravates the pain. The pain is relieved by Nothing. She has tried nothing for the symptoms.   RN Note: Cheryl Benson is a 31 y.o. at [redacted]w[redacted]d here in MAU reporting: contractions all day that ar every 5-10 minutes. Denies vaginal bleeding or leaking of fluid. Has not felt baby move in the last hour. Reports diarrhea all day.   Pain score: 9/10  Past Medical History: Past Medical History:  Diagnosis Date   Anemia    Trichomonas     Past obstetric history: OB History  Gravida Para Term Preterm AB Living  3 2 2     2   SAB IAB Ectopic Multiple Live Births          2    # Outcome Date GA Lbr Len/2nd Weight Sex Delivery Anes PTL Lv  3 Current           2 Term 07/01/18    F Vag-Spont EPI  LIV  1 Term 10/06/11 [redacted]w[redacted]d  3325 g F Vag-Spont EPI  LIV    Past Surgical History: Past Surgical History:  Procedure Laterality Date   COSMETIC SURGERY      Family History: History reviewed. No pertinent family history.  Social History: Social History   Tobacco Use   Smoking status: Never   Smokeless tobacco: Never  Vaping Use   Vaping Use: Never used  Substance Use Topics   Alcohol use: No   Drug use: No    Allergies: No Known  Allergies  Meds:  Medications Prior to Admission  Medication Sig Dispense Refill Last Dose   Prenatal Vit-Fe Fumarate-FA (PRENATAL MULTIVITAMIN) TABS Take 1 tablet by mouth daily.   07/14/2022   ferrous sulfate 325 (65 FE) MG EC tablet Take 325 mg by mouth daily with breakfast.      promethazine (PHENERGAN) 25 MG tablet Take 25 mg by mouth every 6 (six) hours as needed for nausea or vomiting.      Vitamin D, Cholecalciferol, 10 MCG (400 UNIT) TABS Take by mouth.       I have reviewed patient's Past Medical Hx, Surgical Hx, Family Hx, Social Hx, medications and allergies.   ROS:  Review of Systems  Constitutional:  Negative for fever.  Gastrointestinal:  Positive for abdominal pain. Negative for diarrhea.  Genitourinary:  Negative for frequency.  Musculoskeletal:  Negative for myalgias.   Other systems negative  Physical Exam  No data found. Constitutional: Well-developed, well-nourished female in no acute distress.  Cardiovascular: normal rate and rhythm Respiratory: normal effort, clear to auscultation bilaterally GI: Abd soft, non-tender, gravid appropriate for gestational age.   No rebound or guarding. MS: Extremities nontender, no edema, normal ROM Neurologic: Alert and oriented x 4.  GU: Neg CVAT.  PELVIC EXAM: cervix closed/40%/-2/vertex  FHT:  Baseline 140 , moderate variability, accelerations present, no  decelerations Contractions: q 2-4 mins Irregular     Labs: Results for orders placed or performed during the hospital encounter of 07/15/22 (from the past 24 hour(s))  Urinalysis, Routine w reflex microscopic Urine, Clean Catch     Status: Abnormal   Collection Time: 07/15/22 12:41 AM  Result Value Ref Range   Color, Urine YELLOW YELLOW   APPearance CLEAR CLEAR   Specific Gravity, Urine 1.018 1.005 - 1.030   pH 6.0 5.0 - 8.0   Glucose, UA NEGATIVE NEGATIVE mg/dL   Hgb urine dipstick NEGATIVE NEGATIVE   Bilirubin Urine NEGATIVE NEGATIVE   Ketones, ur NEGATIVE  NEGATIVE mg/dL   Protein, ur NEGATIVE NEGATIVE mg/dL   Nitrite NEGATIVE NEGATIVE   Leukocytes,Ua NEGATIVE NEGATIVE   RBC / HPF 0-5 0 - 5 RBC/hpf   WBC, UA 0-5 0 - 5 WBC/hpf   Bacteria, UA RARE (A) NONE SEEN   Squamous Epithelial / LPF 0-5 0 - 5   Mucus PRESENT        Imaging:    MAU Course/MDM: I have reviewed the triage vital signs and the nursing notes.   Pertinent labs & imaging results that were available during my care of the patient were reviewed by me and considered in my medical decision making (see chart for details).      I have reviewed her medical records including past results, notes and treatments.   I have ordered labs and reviewed results. UA is clear NST reviewed, reassuring  Consult Dr Macon Large with presentation, exam findings and test results.  Treatments in MAU included EFM, recommended tocolysis with Procardia, patient declines.  States that the baby is big and it was breathing really well on ultrasound..  Explained that fetal size is not equivalent to maturity/age and that fetal breathing does not mean the lungs are mature. Still declines.   Rechecked an hour later per recommendation Dr Macon Large, changed to 1/30-40%/-2 She recommends tocolysis and if declined, recheck in 1 more hour.   2094:  Closed/40%/Ballotable/-3-2 0153:  1/40%/-2 0316:   Loose 1-1.5/50-60%/-2  Consulted Dr Sallye Ober with exam findings and history.  She will check with NICU about whether we have to transfer patient or not and then will come and examine patient.   Assessment: Single IUP at [redacted]w[redacted]d Preterm uterine contractions Declined tocolysis  Plan: Care turned over to Dr Sallye Ober.   Wynelle Bourgeois CNM, MSN Certified Nurse-Midwife 07/15/2022 12:39 AM

## 2022-08-12 DIAGNOSIS — Z419 Encounter for procedure for purposes other than remedying health state, unspecified: Secondary | ICD-10-CM | POA: Diagnosis not present

## 2022-09-07 ENCOUNTER — Other Ambulatory Visit: Payer: Self-pay | Admitting: Obstetrics and Gynecology

## 2022-09-12 DIAGNOSIS — Z419 Encounter for procedure for purposes other than remedying health state, unspecified: Secondary | ICD-10-CM | POA: Diagnosis not present

## 2022-09-21 DIAGNOSIS — R202 Paresthesia of skin: Secondary | ICD-10-CM | POA: Diagnosis not present

## 2022-09-21 DIAGNOSIS — G5791 Unspecified mononeuropathy of right lower limb: Secondary | ICD-10-CM | POA: Diagnosis not present

## 2022-09-21 DIAGNOSIS — Z9889 Other specified postprocedural states: Secondary | ICD-10-CM | POA: Diagnosis not present

## 2022-10-03 ENCOUNTER — Ambulatory Visit (HOSPITAL_BASED_OUTPATIENT_CLINIC_OR_DEPARTMENT_OTHER): Admit: 2022-10-03 | Payer: Medicaid Other | Admitting: Obstetrics and Gynecology

## 2022-10-03 SURGERY — SALPINGECTOMY, BILATERAL, LAPAROSCOPIC
Anesthesia: Choice | Laterality: Bilateral

## 2022-10-13 DIAGNOSIS — Z419 Encounter for procedure for purposes other than remedying health state, unspecified: Secondary | ICD-10-CM | POA: Diagnosis not present

## 2022-11-11 DIAGNOSIS — Z419 Encounter for procedure for purposes other than remedying health state, unspecified: Secondary | ICD-10-CM | POA: Diagnosis not present

## 2022-12-12 DIAGNOSIS — Z419 Encounter for procedure for purposes other than remedying health state, unspecified: Secondary | ICD-10-CM | POA: Diagnosis not present

## 2023-01-09 ENCOUNTER — Ambulatory Visit: Admit: 2023-01-09 | Payer: Medicaid Other | Admitting: Obstetrics and Gynecology

## 2023-01-09 SURGERY — SALPINGECTOMY, BILATERAL, LAPAROSCOPIC
Anesthesia: General | Laterality: Bilateral

## 2023-01-11 DIAGNOSIS — Z419 Encounter for procedure for purposes other than remedying health state, unspecified: Secondary | ICD-10-CM | POA: Diagnosis not present

## 2023-02-09 ENCOUNTER — Ambulatory Visit: Payer: No Typology Code available for payment source | Admitting: Family

## 2023-02-09 ENCOUNTER — Encounter: Payer: Self-pay | Admitting: Family

## 2023-02-09 VITALS — BP 122/78 | HR 97 | Temp 96.8°F | Resp 20 | Ht 63.0 in | Wt 177.6 lb

## 2023-02-09 DIAGNOSIS — Z01419 Encounter for gynecological examination (general) (routine) without abnormal findings: Secondary | ICD-10-CM | POA: Diagnosis not present

## 2023-02-09 DIAGNOSIS — F321 Major depressive disorder, single episode, moderate: Secondary | ICD-10-CM | POA: Diagnosis not present

## 2023-02-09 DIAGNOSIS — G4700 Insomnia, unspecified: Secondary | ICD-10-CM | POA: Diagnosis not present

## 2023-02-09 DIAGNOSIS — Z113 Encounter for screening for infections with a predominantly sexual mode of transmission: Secondary | ICD-10-CM | POA: Diagnosis not present

## 2023-02-09 DIAGNOSIS — Z0000 Encounter for general adult medical examination without abnormal findings: Secondary | ICD-10-CM

## 2023-02-09 DIAGNOSIS — Z3040 Encounter for surveillance of contraceptives, unspecified: Secondary | ICD-10-CM | POA: Diagnosis not present

## 2023-02-09 MED ORDER — MELATONIN 3 MG PO TABS: 3.0000 mg | ORAL_TABLET | Freq: Every day | ORAL | 5 refills | Status: DC

## 2023-02-09 MED ORDER — TRAZODONE HCL 50 MG PO TABS: 25.0000 mg | ORAL_TABLET | Freq: Every evening | ORAL | 3 refills | Status: DC | PRN

## 2023-02-09 NOTE — Progress Notes (Signed)
Provider: Richarda Blade FNP-C   Sharley Keeler, Donalee Citrin, NP  Patient Care Team: Enis Riecke, Donalee Citrin, NP as PCP - General (Family Medicine) Ob/Gyn, [facility] (Obstetrics and Gynecology)  Extended Emergency Contact Information Primary Emergency Contact: [employer] Mobile Phone: [phone] Relation: Grandmother Secondary Emergency Contact: Dungee,Timothy Mobile Phone: [phone] Relation: Friend Preferred language: [place] Interpreter needed? No  Code Status:  Full Code  Goals of care: Advanced Directive information    07/15/2022   12:33 AM  Advanced Directives  Does Patient Have a Medical Advance Directive? No  Would patient like information on creating a medical advance directive? No - Patient declined     Chief Complaint  Patient presents with   New Patient (Initial Visit)    Patient presents today for a new patient appointment.    HPI:  Pt is a 32 y.o. female seen today establish care here at [facility] and Adult  care for medical management of chronic diseases.Has a medical history eczema and seasonal allergies. States had a baby 7 months ago. Has returned to work.she works from home.Has had increased stress level due to new work load and caring for 76 month old and her two other children.Has no family member close all live in [place].Has not been able to concentrate at work.Also having mood swings,agitated easily towards her children and other people.Feels more depressed sometimes does not feeling like getting up.Has no appetite.Also not sleeping well at night.requests sleeping pills.   Counselor consultation on June 10 th  Due for COVID-19 vaccine aware to get vaccine at the pharmacy.  Also due for cervical cancer screening.States has appointment with her GYN today at 2 pm. She would also like to start on birth control pill.   LMP : 02/09/2023 on going. States no bleeding or discharge.  Has no history of cigarette smoking or use of illicit drugs. Does  drink alcohol 2-3 drinks occasionally.Has had no drink for a time.   Past Medical History:  Diagnosis Date   Anemia    Trichomonas    Past Surgical History:  Procedure Laterality Date   COSMETIC SURGERY      No Known Allergies  Allergies as of 02/09/2023   No Known Allergies      Medication List        Accurate as of Feb 09, 2023  1:01 PM. If you have any questions, ask your nurse or doctor.          STOP taking these medications    ferrous sulfate 325 (65 FE) MG EC tablet Stopped by: Caesar Bookman, NP   prenatal multivitamin Tabs tablet Stopped by: Caesar Bookman, NP   promethazine 25 MG tablet Commonly known as: PHENERGAN Stopped by: Caesar Bookman, NP   Vitamin D (Cholecalciferol) 10 MCG (400 UNIT) Tabs Stopped by: Caesar Bookman, NP        Review of Systems  Constitutional:  Negative for appetite change, chills, fatigue, fever and unexpected weight change.  HENT:  Negative for congestion, dental problem, ear discharge, ear pain, facial swelling, hearing loss, nosebleeds, postnasal drip, rhinorrhea, sinus pressure, sinus pain, sneezing, sore throat, tinnitus and trouble swallowing.   Eyes:  Negative for pain, discharge, redness, itching and visual disturbance.  Respiratory:  Negative for cough, chest tightness, shortness of breath and wheezing.   Cardiovascular:  Negative for chest pain, palpitations and leg swelling.  Gastrointestinal:  Negative for abdominal distention, abdominal pain, blood in stool, constipation, diarrhea, nausea and vomiting.  Endocrine: Negative for cold  intolerance, heat intolerance, polydipsia, polyphagia and polyuria.  Genitourinary:  Negative for difficulty urinating, dysuria, flank pain, frequency and urgency.  Musculoskeletal:  Negative for arthralgias, back pain, gait problem, joint swelling, myalgias, neck pain and neck stiffness.  Skin:  Negative for color change, pallor, rash and wound.  Neurological:  Negative for  dizziness, syncope, speech difficulty, weakness, light-headedness, numbness and headaches.  Hematological:  Does not bruise/bleed easily.  Psychiatric/Behavioral:  Positive for agitation and sleep disturbance. Negative for behavioral problems, confusion, hallucinations, self-injury and suicidal ideas. The patient is nervous/anxious.        Increased stress level caring for 18 month old and two other children and working from home.     Immunization History  Administered Date(s) Administered   Tdap 10/07/2011, 05/04/2018   Pertinent  Health Maintenance Due  Topic Date Due   PAP SMEAR-Modifier  Never done   INFLUENZA VACCINE  04/13/2023      01/19/2022   11:49 AM 06/14/2022    7:30 PM 07/13/2022    6:48 PM 07/15/2022   12:35 AM  Fall Risk  (RETIRED) Patient Fall Risk Level Low fall risk Low fall risk Low fall risk Low fall risk   Functional Status Survey:    Vitals:   02/09/23 1236  BP: 122/78  Pulse: 97  Resp: 20  Temp: (!) 96.8 F (36 C)  SpO2: 97%  Weight: 177 lb 9.6 oz (80.6 kg)  Height: 5\' 3"  (1.6 m)   Body mass index is 31.46 kg/m. Physical Exam Vitals reviewed.  Constitutional:      General: She is not in acute distress.    Appearance: Normal appearance. She is obese. She is not ill-appearing or diaphoretic.  HENT:     Head: Normocephalic.     Right Ear: Tympanic membrane, ear canal and external ear normal. There is no impacted cerumen.     Left Ear: Tympanic membrane, ear canal and external ear normal. There is no impacted cerumen.     Nose: Nose normal. No congestion or rhinorrhea.     Mouth/Throat:     Mouth: Mucous membranes are moist.     Pharynx: Oropharynx is clear. No oropharyngeal exudate or posterior oropharyngeal erythema.  Eyes:     General: No scleral icterus.       Right eye: No discharge.        Left eye: No discharge.     Extraocular Movements: Extraocular movements intact.     Conjunctiva/sclera: Conjunctivae normal.     Pupils: Pupils are  equal, round, and reactive to light.  Neck:     Vascular: No carotid bruit.  Cardiovascular:     Rate and Rhythm: Normal rate and regular rhythm.     Pulses: Normal pulses.     Heart sounds: Normal heart sounds. No murmur heard.    No friction rub. No gallop.  Pulmonary:     Effort: Pulmonary effort is normal. No respiratory distress.     Breath sounds: Normal breath sounds. No wheezing, rhonchi or rales.  Chest:     Chest wall: No tenderness.  Abdominal:     General: Bowel sounds are normal. There is no distension.     Palpations: Abdomen is soft. There is no mass.     Tenderness: There is no abdominal tenderness. There is no right CVA tenderness, left CVA tenderness, guarding or rebound.  Musculoskeletal:        General: No swelling or tenderness. Normal range of motion.     Cervical back:  Normal range of motion. No rigidity or tenderness.     Right lower leg: No edema.     Left lower leg: No edema.  Lymphadenopathy:     Cervical: No cervical adenopathy.  Skin:    General: Skin is warm and dry.     Coloration: Skin is not pale.     Findings: No bruising, erythema, lesion or rash.  Neurological:     Mental Status: She is alert and oriented to person, place, and time.     Cranial Nerves: No cranial nerve deficit.     Sensory: No sensory deficit.     Motor: No weakness.     Coordination: Coordination normal.     Gait: Gait normal.  Psychiatric:        Mood and Affect: Mood is depressed.        Speech: Speech normal.        Behavior: Behavior normal.        Thought Content: Thought content normal.        Judgment: Judgment normal.    Labs reviewed: No results for input(s): "NA", "K", "CL", "CO2", "GLUCOSE", "BUN", "CREATININE", "CALCIUM", "MG", "PHOS" in the last 8760 hours. No results for input(s): "AST", "ALT", "ALKPHOS", "BILITOT", "PROT", "ALBUMIN" in the last 8760 hours. No results for input(s): "WBC", "NEUTROABS", "HGB", "HCT", "MCV", "PLT" in the last 8760  hours. No results found for: "TSH" No results found for: "HGBA1C" No results found for: "CHOL", "HDL", "LDLCALC", "LDLDIRECT", "TRIG", "CHOLHDL"  Significant Diagnostic Results in last 30 days:  No results found.  Assessment/Plan 1. Current moderate episode of major depressive disorder without prior episode (HCC) Symptoms have worsen post delivery with increased stress level at work and caring for the children without any support. Recommend start on antidepressant but declined would like to see a counselor prior to taking medication.Has upcoming appointment 02/20/2023 - start on melatonin for sleep which might help with concentration during the day.May take Trazodone as needed if melatonin ineffective.  - melatonin 3 MG TABS tablet; Take 1 tablet (3 mg total) by mouth at bedtime.  Dispense: 30 tablet; Refill: 5 - traZODone (DESYREL) 50 MG tablet; Take 0.5-1 tablets (25-50 mg total) by mouth at bedtime as needed for sleep.  Dispense: 30 tablet; Refill: 3 - TSH; Future - COMPLETE METABOLIC PANEL WITH GFR; Future - CBC with Differential/Platelet; Future  2. Insomnia, unspecified type Recommend melatonin as below if still unable to sleep take trazodone PRN  - melatonin 3 MG TABS tablet; Take 1 tablet (3 mg total) by mouth at bedtime.  Dispense: 30 tablet; Refill: 5 - traZODone (DESYREL) 50 MG tablet; Take 0.5-1 tablets (25-50 mg total) by mouth at bedtime as needed for sleep.  Dispense: 30 tablet; Refill: 3 - TSH; Future  3. Encounter for medical examination to establish care Immunization up to date except due COVID-19 vaccine.recommend fasting labs.Pap smear with Gynecologist.   Family/ staff Communication: Reviewed plan of care with patient verbalized understanding.   Labs/tests ordered:  - TSH; Future - COMPLETE METABOLIC PANEL WITH GFR; Future - CBC with Differential/Platelet; Future  Next Appointment : Return in about 1 year (around 02/09/2024) for annual Physical examination,  fasting labs in one week.   Caesar Bookman, NP

## 2023-02-10 ENCOUNTER — Other Ambulatory Visit: Payer: Self-pay

## 2023-02-10 DIAGNOSIS — G4700 Insomnia, unspecified: Secondary | ICD-10-CM

## 2023-02-10 DIAGNOSIS — F321 Major depressive disorder, single episode, moderate: Secondary | ICD-10-CM

## 2023-02-11 DIAGNOSIS — Z419 Encounter for procedure for purposes other than remedying health state, unspecified: Secondary | ICD-10-CM | POA: Diagnosis not present

## 2023-02-14 ENCOUNTER — Other Ambulatory Visit: Payer: No Typology Code available for payment source

## 2023-03-09 IMAGING — US US OB < 14 WEEKS - US OB TV
1 series · 15 of 28 positions shown · non-contrast
Comparison: None Available.

CLINICAL DATA: Vaginal bleeding.

EXAM:
OBSTETRIC <14 WK US AND TRANSVAGINAL OB US
TECHNIQUE: Both transabdominal and transvaginal ultrasound examinations were
performed for complete evaluation of the gestation as well as the
maternal uterus, adnexal regions, and pelvic cul-de-sac.
Transvaginal technique was performed to assess early pregnancy.

[Series 1: us ob < 14 weeks - us ob tv · 15 of 59 slices shown]
[im 1/59]
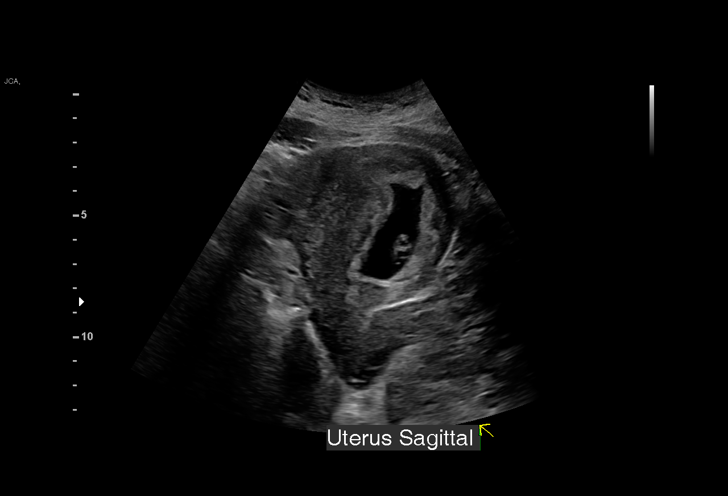
[im 5/59]
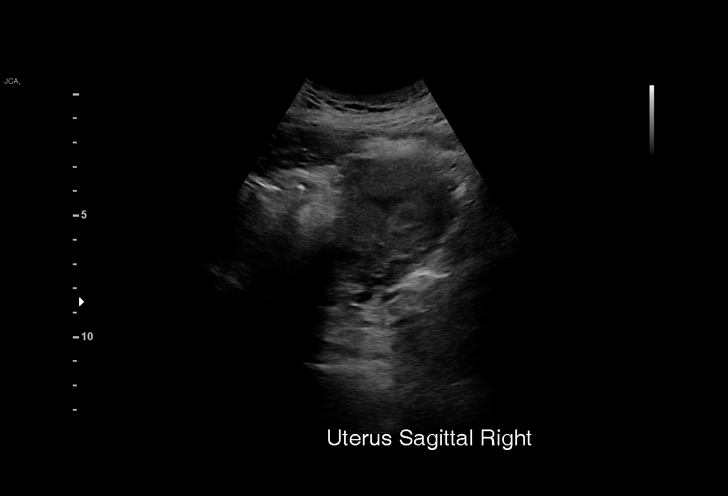
[im 9/59]
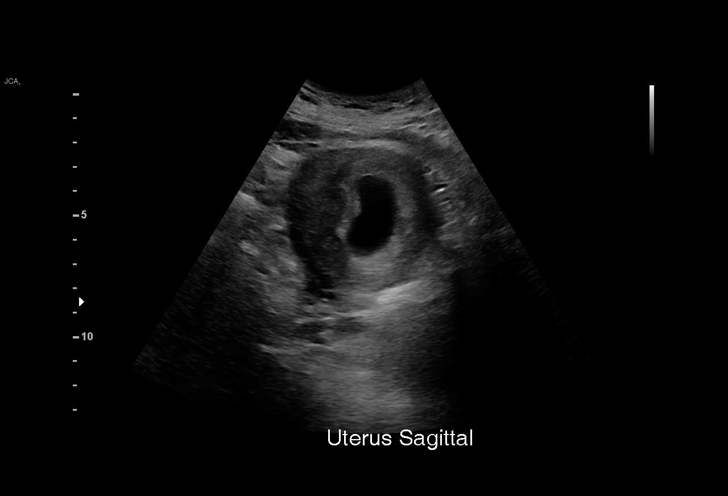
[im 13/59]
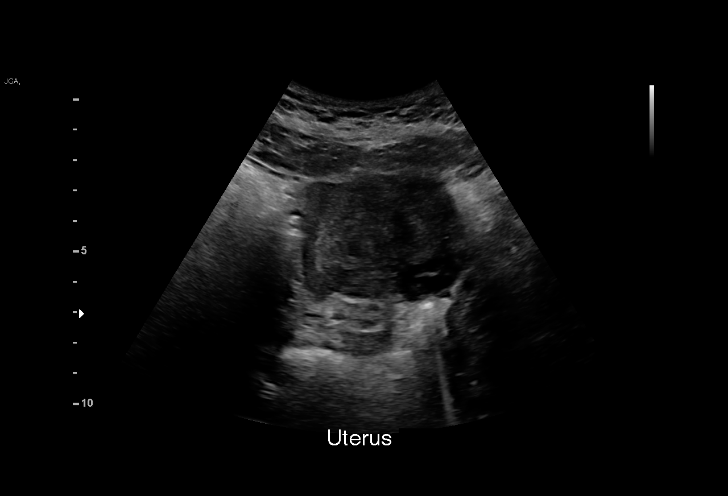
[im 18/59]
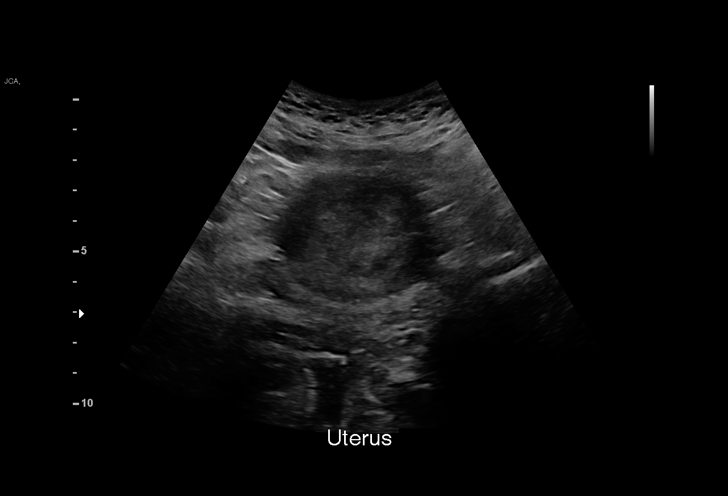
[im 22/59]
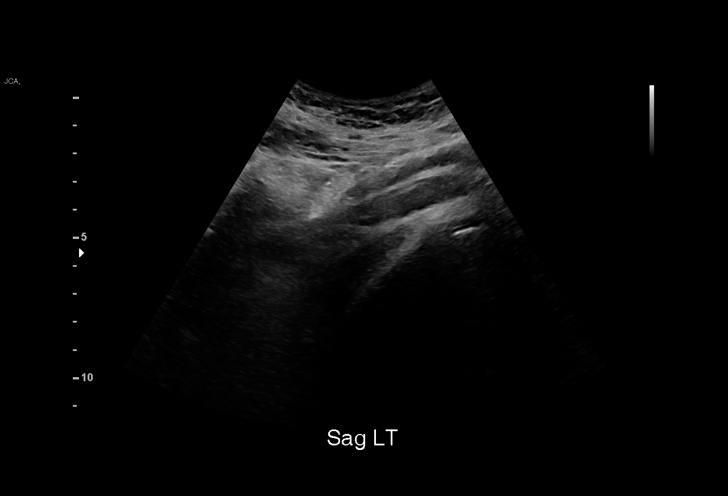
[im 26/59]
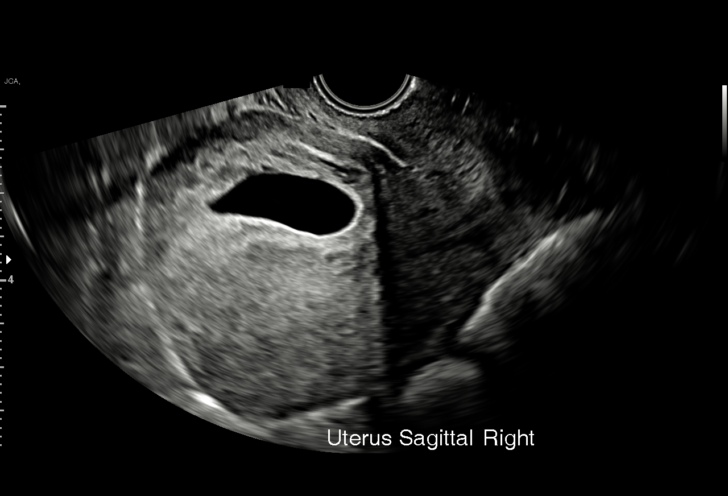
[im 31/59]
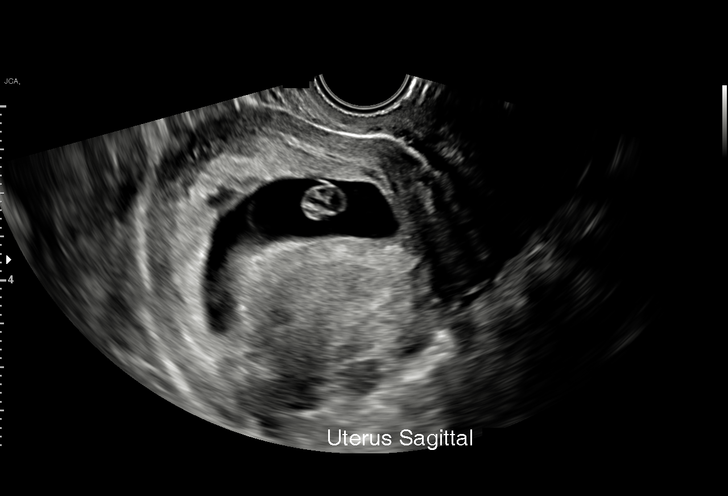
[im 33/59]
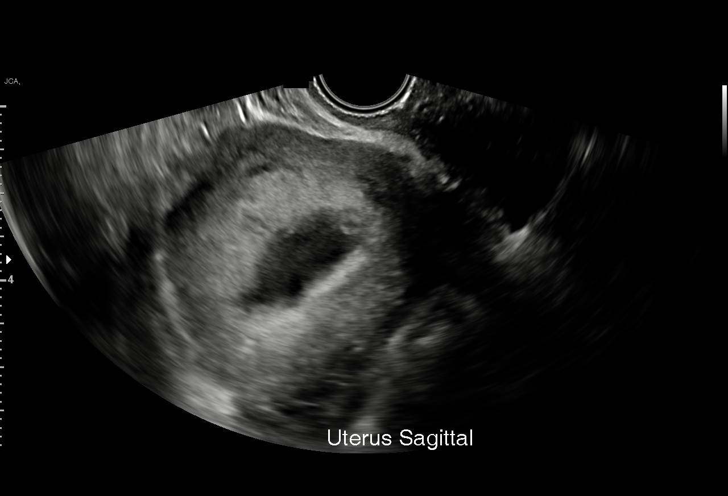
[im 37/59]
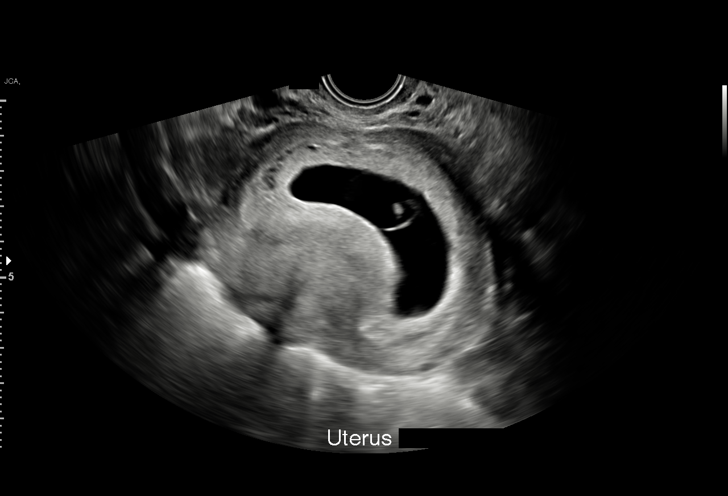
[im 41/59]
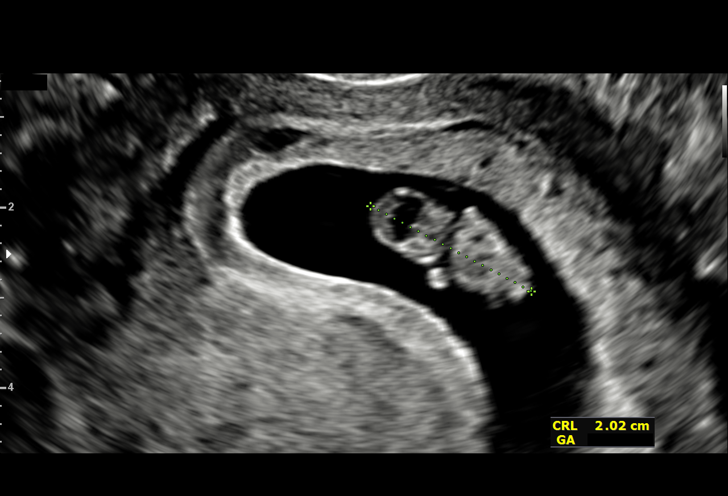
[im 46/59]
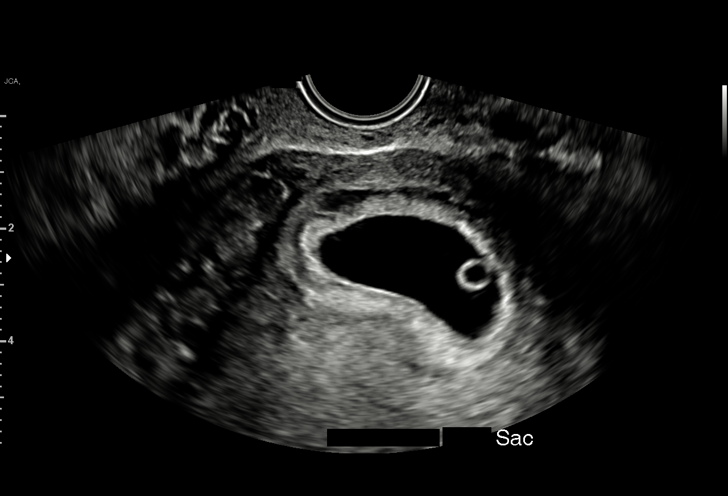
[im 50/59]
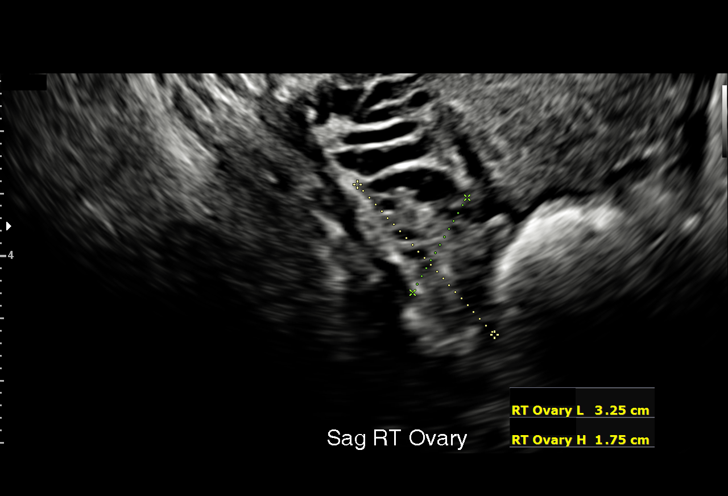
[im 54/59]
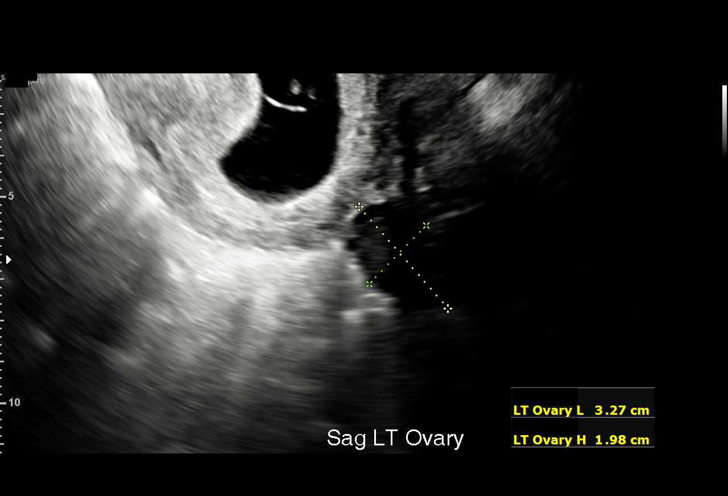
[im 59/59]
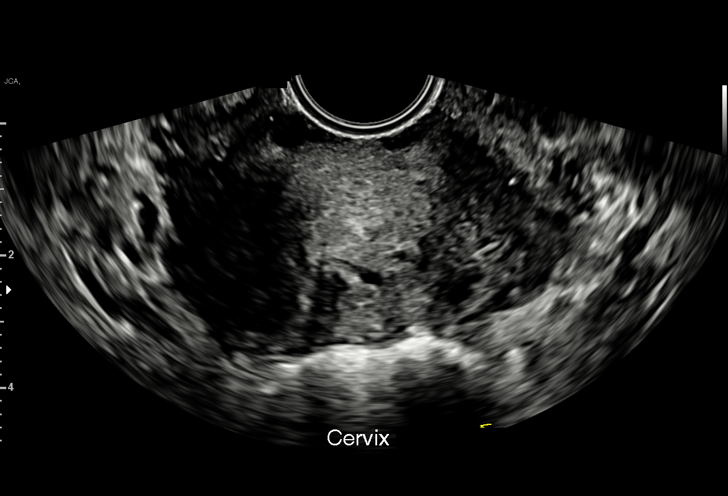

[15 of 28 positions shown; findings below may reference images not displayed]

FINDINGS: Intrauterine gestational sac: Single

Yolk sac:  Visualized.

Embryo:  Visualized.

Cardiac Activity: Visualized.

Heart Rate: 169 bpm

CRL: 20.8 mm 8 w 4 d US EDC: August 27, 2022.

Subchorionic hemorrhage:  None visualized.

Maternal uterus/adnexae: Ovaries are unremarkable. No free fluid is
noted.
IMPRESSION: Single live intrauterine gestation of 8 weeks 4 days.

## 2023-03-13 DIAGNOSIS — Z419 Encounter for procedure for purposes other than remedying health state, unspecified: Secondary | ICD-10-CM | POA: Diagnosis not present

## 2023-03-29 IMAGING — US US OB COMP LESS 14 WK
1 series · 15 of 28 positions shown · non-contrast
Comparison: None Available.

CLINICAL DATA: Spotting and vaginal bleeding. Obstetrical
ultrasound 01/19/2022.

EXAM:
OBSTETRIC <14 WK ULTRASOUND
TECHNIQUE: Transabdominal ultrasound was performed for evaluation of the
gestation as well as the maternal uterus and adnexal regions.

[Series 1: us ob comp less 14 wk · 15 of 36 slices shown]
[im 1/36]
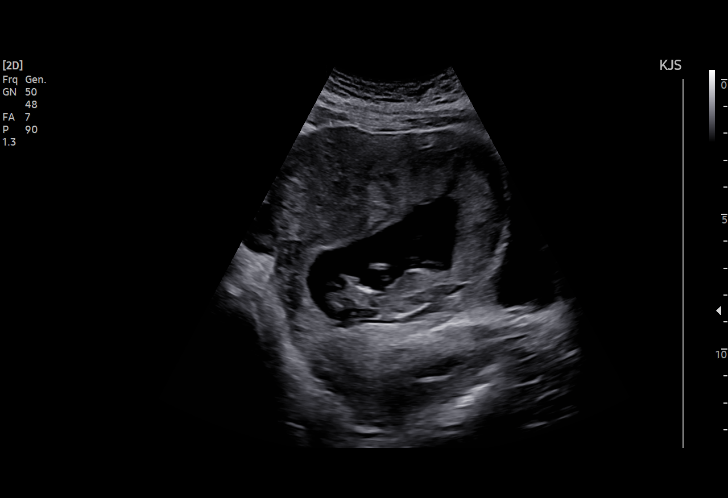
[im 3/36]
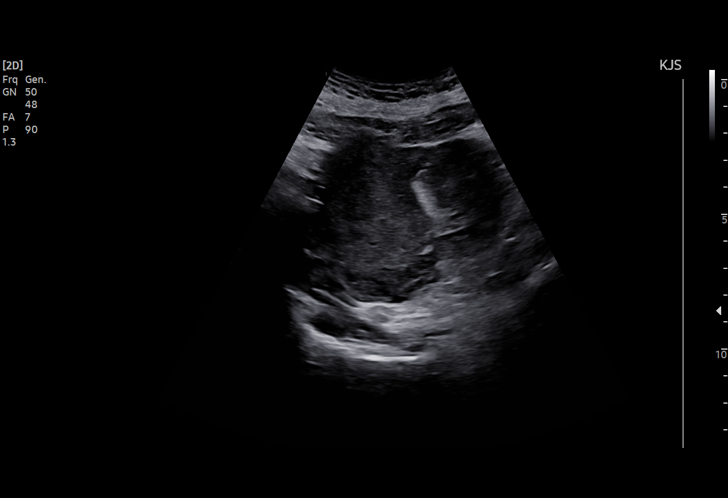
[im 6/36]
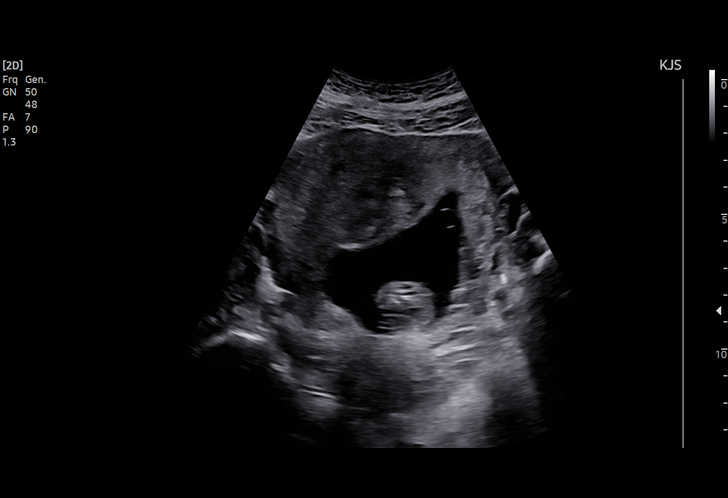
[im 8/36]
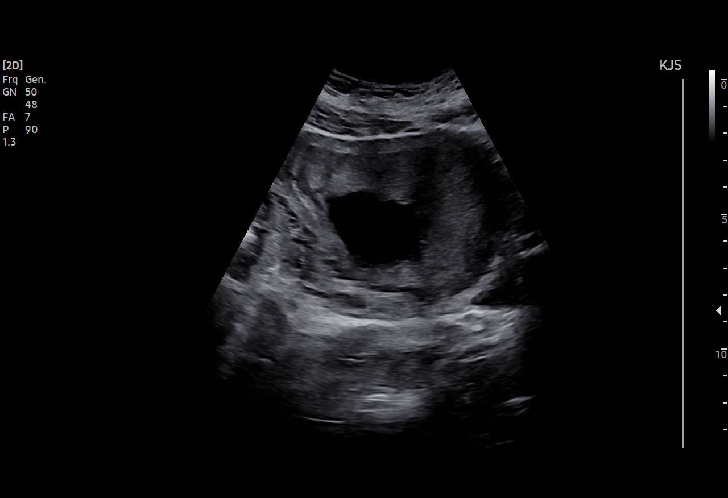
[im 11/36]
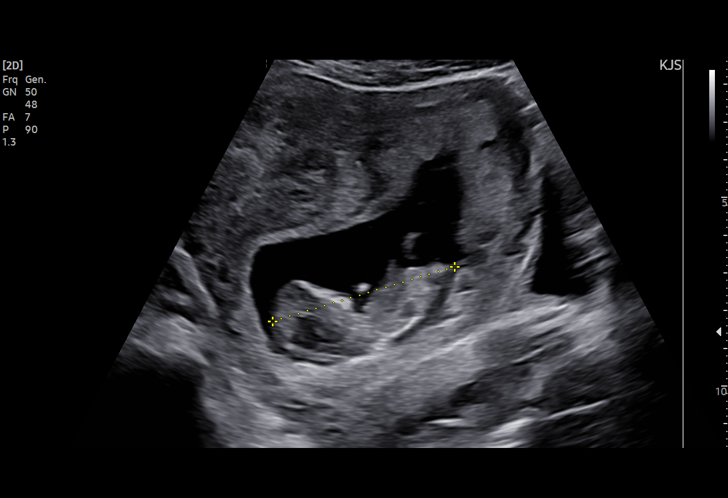
[im 13/36]
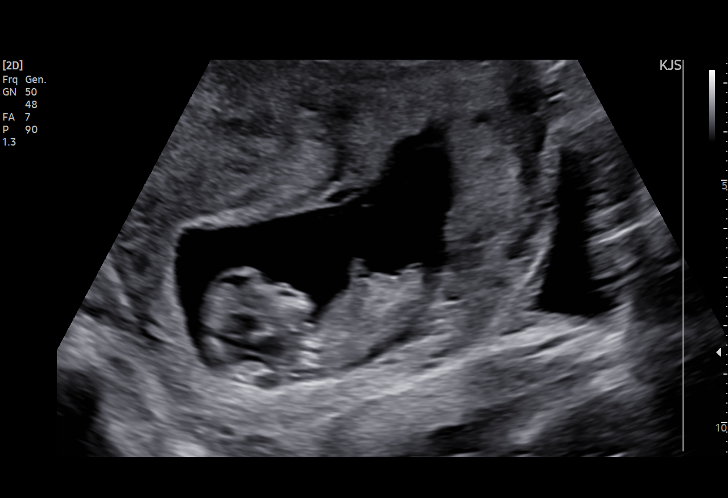
[im 16/36]
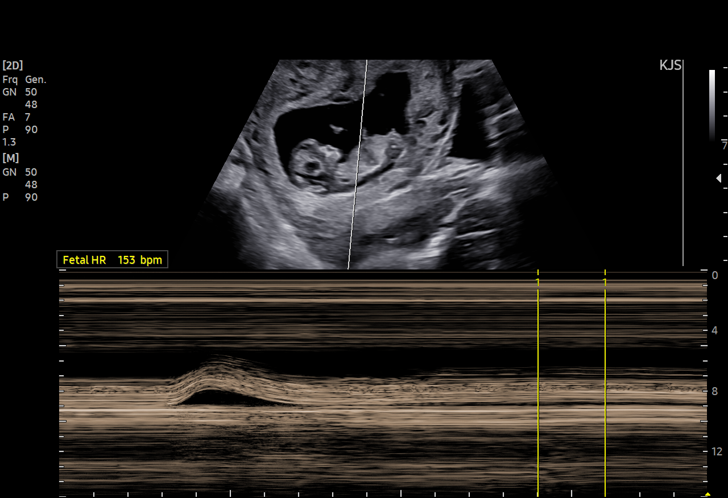
[im 19/36]
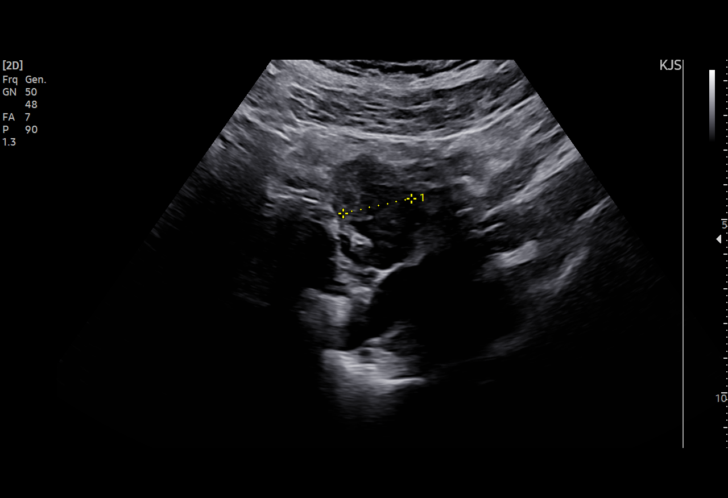
[im 20/36]
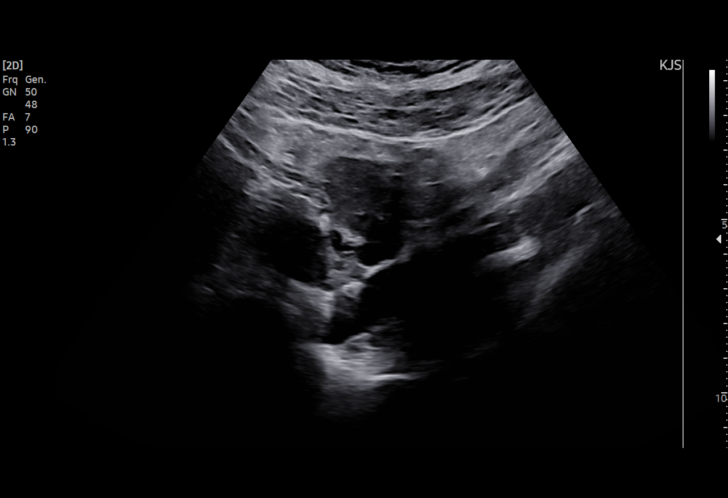
[im 23/36]
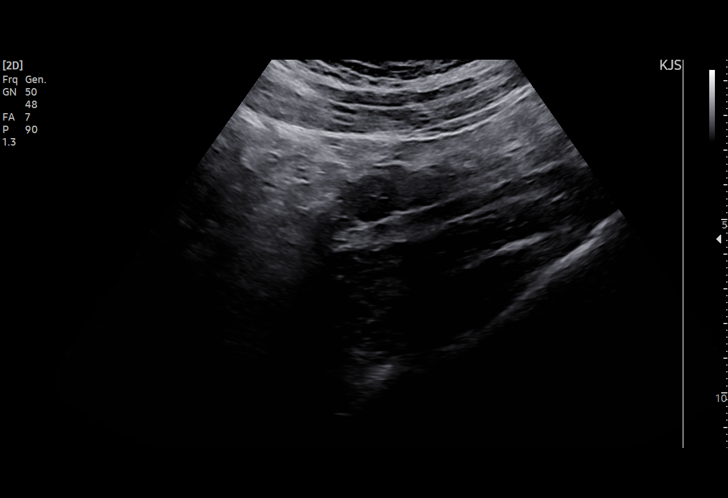
[im 25/36]
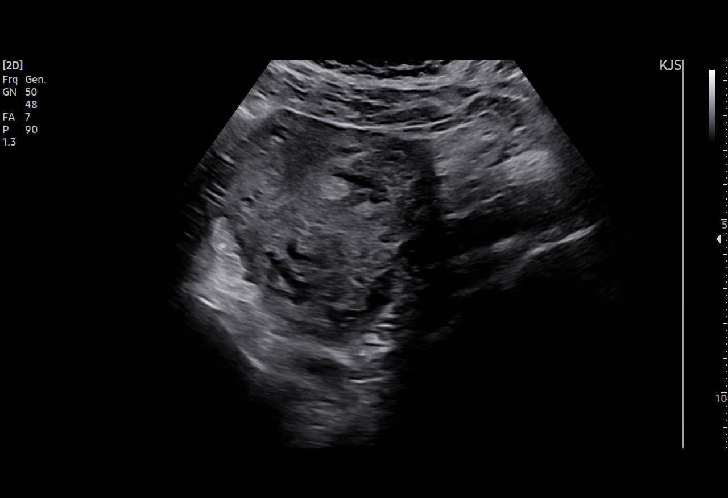
[im 28/36]
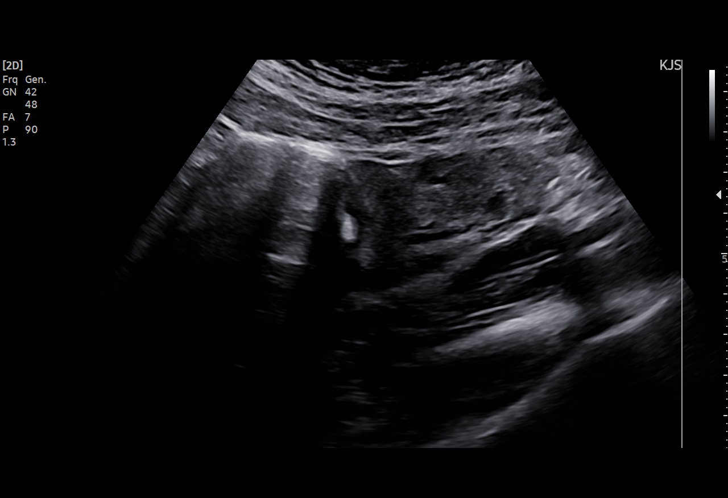
[im 30/36]
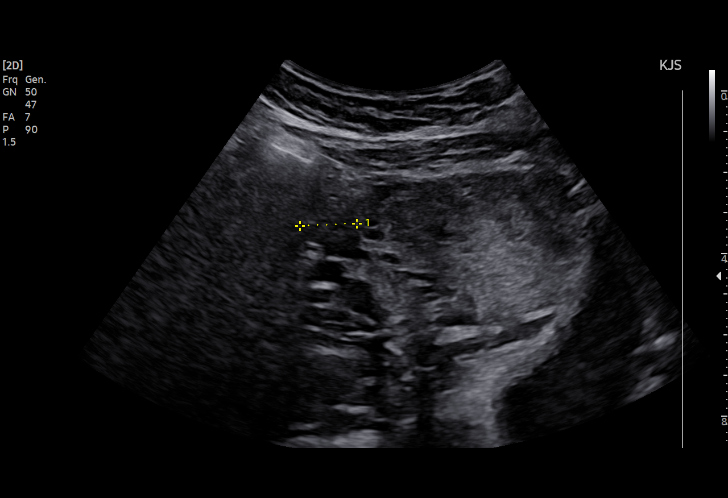
[im 33/36]
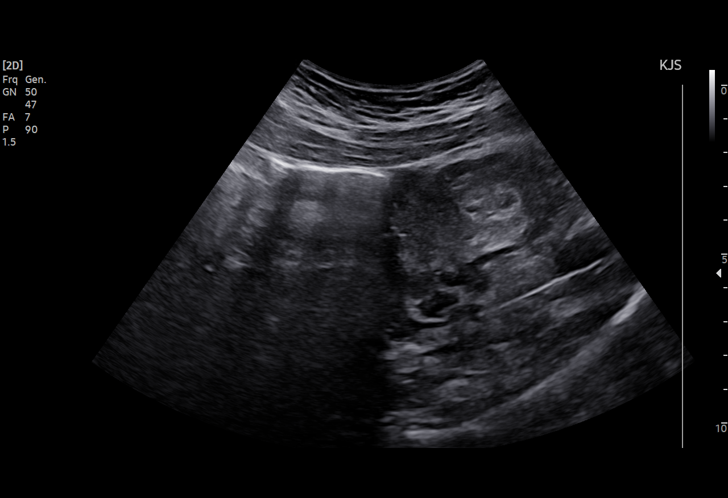
[im 36/36]
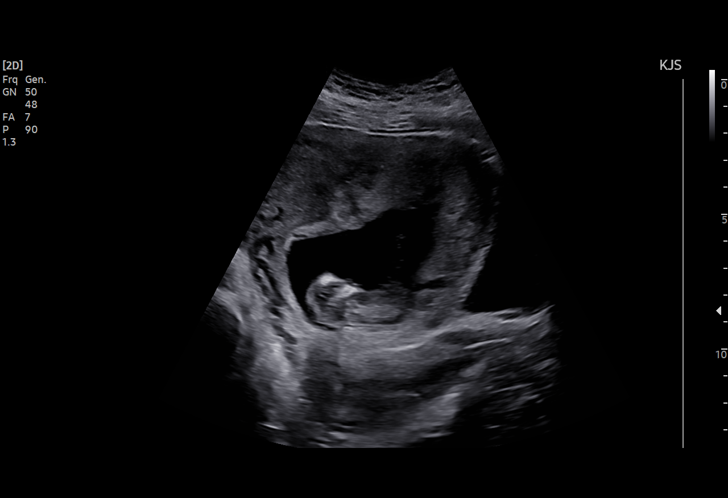

[15 of 28 positions shown; findings below may reference images not displayed]

FINDINGS: Intrauterine gestational sac: Single

Yolk sac:  Not Visualized.

Embryo:  Visualized.

Cardiac Activity: Visualized.

Heart Rate: 153 bpm

CRL:   48.1 mm   11 w 4 d                  US EDC: 08/26/2022

Subchorionic hemorrhage:  None visualized.

Maternal uterus/adnexae: Bilateral ovaries appear within normal
limits. No free fluid.
IMPRESSION: 1. Single live intrauterine gestation measuring 11 weeks 4 days on
current ultrasound. Assigned gestational age 12 weeks 1 day.

## 2023-04-13 DIAGNOSIS — Z419 Encounter for procedure for purposes other than remedying health state, unspecified: Secondary | ICD-10-CM | POA: Diagnosis not present

## 2023-04-14 ENCOUNTER — Encounter: Payer: Self-pay | Admitting: Family

## 2023-04-14 ENCOUNTER — Ambulatory Visit (INDEPENDENT_AMBULATORY_CARE_PROVIDER_SITE_OTHER): Payer: No Typology Code available for payment source | Admitting: Family

## 2023-04-14 VITALS — BP 110/74 | HR 84 | Temp 97.5°F | Resp 18 | Ht 63.0 in | Wt 169.0 lb

## 2023-04-14 DIAGNOSIS — R630 Anorexia: Secondary | ICD-10-CM | POA: Diagnosis not present

## 2023-04-14 DIAGNOSIS — F321 Major depressive disorder, single episode, moderate: Secondary | ICD-10-CM

## 2023-04-14 DIAGNOSIS — F411 Generalized anxiety disorder: Secondary | ICD-10-CM | POA: Diagnosis not present

## 2023-04-14 DIAGNOSIS — R634 Abnormal weight loss: Secondary | ICD-10-CM

## 2023-04-14 DIAGNOSIS — G4700 Insomnia, unspecified: Secondary | ICD-10-CM

## 2023-04-14 NOTE — Progress Notes (Signed)
Provider: Richarda Blade FNP-C  Jelissa Espiritu, Donalee Citrin, NP  Patient Care Team: Jadi Deyarmin, Donalee Citrin, NP as PCP - General (Family Medicine) Ob/Gyn, [facility] (Obstetrics and Gynecology)  Extended Emergency Contact Information Primary Emergency Contact: [employer] Mobile Phone: [phone] Relation: Grandmother Secondary Emergency Contact: Dungee,Timothy Mobile Phone: [phone] Relation: Friend Preferred language: [place] Interpreter needed? No  Code Status:  Full Code  Goals of care: Advanced Directive information    07/15/2022   12:33 AM  Advanced Directives  Does Patient Have a Medical Advance Directive? No  Would patient like information on creating a medical advance directive? No - Patient declined     Chief Complaint  Patient presents with   Acute Visit    Patient is here for short term disability paper work and labs    HPI:  Pt is a 32 y.o. female seen today for an acute visit to complete disability paper work.States has been depressed unable to concentrate.she used to work at home but unable to work.Feels over whelmed caring for her two children and working. Has had increased in anxiety and decreased appetite.states has lost weight since last seen here. She follows up with a counselor but states does not think it's helping with the depression. Previously prescribed medication but did not like it. States still not sleeping well at night     Past Medical History:  Diagnosis Date   Anemia    Trichomonas    Past Surgical History:  Procedure Laterality Date   COSMETIC SURGERY      Allergies  Allergen Reactions   Metronidazole Nausea And Vomiting    Outpatient Encounter Medications as of 04/14/2023  Medication Sig   melatonin 3 MG TABS tablet Take 1 tablet (3 mg total) by mouth at bedtime.   traZODone (DESYREL) 50 MG tablet Take 0.5-1 tablets (25-50 mg total) by mouth at bedtime as needed for sleep.   No facility-administered encounter medications on  file as of 04/14/2023.    Review of Systems  Constitutional:  Negative for appetite change, chills, fatigue, fever and unexpected weight change.  HENT:  Negative for congestion, dental problem, ear discharge, ear pain, facial swelling, hearing loss, nosebleeds, postnasal drip, rhinorrhea, sinus pressure, sinus pain, sneezing, sore throat, tinnitus and trouble swallowing.   Eyes:  Negative for pain, discharge, redness, itching and visual disturbance.  Respiratory:  Negative for cough, chest tightness, shortness of breath and wheezing.   Cardiovascular:  Negative for chest pain, palpitations and leg swelling.  Gastrointestinal:  Negative for abdominal distention, abdominal pain, blood in stool, constipation, diarrhea, nausea and vomiting.  Endocrine: Negative for cold intolerance, heat intolerance, polydipsia, polyphagia and polyuria.  Genitourinary:  Negative for difficulty urinating, dysuria, flank pain, frequency and urgency.  Musculoskeletal:  Negative for arthralgias, back pain, gait problem, joint swelling, myalgias, neck pain and neck stiffness.  Skin:  Negative for color change, pallor, rash and wound.  Neurological:  Negative for dizziness, syncope, speech difficulty, weakness, light-headedness, numbness and headaches.  Hematological:  Does not bruise/bleed easily.  Psychiatric/Behavioral:  Positive for agitation and sleep disturbance. Negative for behavioral problems, confusion, hallucinations, self-injury and suicidal ideas. The patient is nervous/anxious.        Worsening depression     Immunization History  Administered Date(s) Administered   Tdap 10/07/2011, 05/04/2018   Pertinent  Health Maintenance Due  Topic Date Due   PAP SMEAR-Modifier  Never done   INFLUENZA VACCINE  04/13/2023      01/19/2022   11:49 AM 06/14/2022  7:30 PM 07/13/2022    6:48 PM 07/15/2022   12:35 AM  Fall Risk  (RETIRED) Patient Fall Risk Level Low fall risk Low fall risk Low fall risk Low fall risk    Functional Status Survey:    Vitals:   04/14/23 1416  BP: 110/74  Pulse: 84  Resp: 18  Temp: (!) 97.5 F (36.4 C)  SpO2: 96%  Weight: 169 lb (76.7 kg)  Height: 5\' 3"  (1.6 m)   Body mass index is 29.94 kg/m. Physical Exam Vitals reviewed.  Constitutional:      General: She is not in acute distress.    Appearance: Normal appearance. She is overweight. She is not ill-appearing or diaphoretic.  HENT:     Head: Normocephalic.     Right Ear: Tympanic membrane, ear canal and external ear normal. There is no impacted cerumen.     Left Ear: Tympanic membrane, ear canal and external ear normal. There is no impacted cerumen.     Nose: Nose normal. No congestion or rhinorrhea.     Mouth/Throat:     Mouth: Mucous membranes are moist.     Pharynx: Oropharynx is clear. No oropharyngeal exudate or posterior oropharyngeal erythema.  Eyes:     General: No scleral icterus.       Right eye: No discharge.        Left eye: No discharge.     Extraocular Movements: Extraocular movements intact.     Conjunctiva/sclera: Conjunctivae normal.     Pupils: Pupils are equal, round, and reactive to light.  Neck:     Vascular: No carotid bruit.  Cardiovascular:     Rate and Rhythm: Normal rate and regular rhythm.     Pulses: Normal pulses.     Heart sounds: Normal heart sounds. No murmur heard.    No friction rub. No gallop.  Pulmonary:     Effort: Pulmonary effort is normal. No respiratory distress.     Breath sounds: Normal breath sounds. No wheezing, rhonchi or rales.  Chest:     Chest wall: No tenderness.  Abdominal:     General: Bowel sounds are normal. There is no distension.     Palpations: Abdomen is soft. There is no mass.     Tenderness: There is no abdominal tenderness. There is no right CVA tenderness, left CVA tenderness, guarding or rebound.  Musculoskeletal:        General: No swelling or tenderness. Normal range of motion.     Cervical back: Normal range of motion. No  rigidity or tenderness.     Right lower leg: No edema.     Left lower leg: No edema.  Lymphadenopathy:     Cervical: No cervical adenopathy.  Skin:    General: Skin is warm and dry.     Coloration: Skin is not pale.     Findings: No bruising, erythema, lesion or rash.  Neurological:     Mental Status: She is alert and oriented to person, place, and time.     Cranial Nerves: No cranial nerve deficit.     Sensory: No sensory deficit.     Motor: No weakness.     Coordination: Coordination normal.     Gait: Gait normal.  Psychiatric:        Mood and Affect: Mood is depressed.        Speech: Speech normal.        Behavior: Behavior normal.        Thought Content: Thought content normal.  Judgment: Judgment normal.     Labs reviewed: No results for input(s): "NA", "K", "CL", "CO2", "GLUCOSE", "BUN", "CREATININE", "CALCIUM", "MG", "PHOS" in the last 8760 hours. No results for input(s): "AST", "ALT", "ALKPHOS", "BILITOT", "PROT", "ALBUMIN" in the last 8760 hours. No results for input(s): "WBC", "NEUTROABS", "HGB", "HCT", "MCV", "PLT" in the last 8760 hours. No results found for: "TSH" No results found for: "HGBA1C" No results found for: "CHOL", "HDL", "LDLCALC", "LDLDIRECT", "TRIG", "CHOLHDL"  Significant Diagnostic Results in last 30 days:  No results found.  Assessment/Plan 1. Current moderate episode of major depressive disorder without prior episode (HCC) -Antidepressant medication recommended but declined.  Thinks if she just takes time off from work and see a Veterinary surgeon that symptoms will improve -Advised to schedule appointment with clinical counselor  2. Insomnia, unspecified type Continue on trazodone  3. Generalized anxiety disorder Not on any medication as above  4. Decreased appetite Encouraged to eat small but frequent meals  5. Weight loss Has had 9 pounds weight loss since last seen 3 months ago. -Suspect due to decreased oral intake secondary to  depression. -Encouraged to increase oral intake  Family/ staff Communication: Reviewed plan of care with patient verbalized understanding  Labs/tests ordered: None   Next Appointment: Return in about 43 weeks (around 02/09/2024) for annual Physical examination.   Caesar Bookman, NP

## 2023-04-17 ENCOUNTER — Telehealth: Payer: Self-pay | Admitting: Family

## 2023-04-17 NOTE — Telephone Encounter (Signed)
Patient called and stated that she spoke with HR and they told her that the Therapist part could be left blank.   Paperwork faxed back to [employer] Fax:[phone]

## 2023-04-17 NOTE — Telephone Encounter (Signed)
The [employer] disability paper work completed during visit patient was supposed to call back with Therapist name,address and telephone to be completed on last page then fax.please contact patient to provide information.

## 2023-05-14 DIAGNOSIS — Z419 Encounter for procedure for purposes other than remedying health state, unspecified: Secondary | ICD-10-CM | POA: Diagnosis not present

## 2023-05-19 ENCOUNTER — Telehealth: Payer: Self-pay | Admitting: Family

## 2023-05-19 NOTE — Telephone Encounter (Signed)
FMLA paperwork completed and placed on outgoing fax box to be faxed or pick up as requested.

## 2023-05-24 ENCOUNTER — Ambulatory Visit (INDEPENDENT_AMBULATORY_CARE_PROVIDER_SITE_OTHER): Payer: Self-pay | Admitting: Licensed Clinical Social Worker

## 2023-05-24 DIAGNOSIS — Z0389 Encounter for observation for other suspected diseases and conditions ruled out: Secondary | ICD-10-CM

## 2023-05-25 NOTE — Telephone Encounter (Signed)
Noted  

## 2023-05-26 ENCOUNTER — Encounter (HOSPITAL_COMMUNITY): Payer: Self-pay | Admitting: Licensed Clinical Social Worker

## 2023-05-26 NOTE — Progress Notes (Signed)
Appointment was double booked with another appointment for [facility] daughter. Appt was rescheduled.

## 2023-06-12 ENCOUNTER — Telehealth (INDEPENDENT_AMBULATORY_CARE_PROVIDER_SITE_OTHER): Payer: Medicaid Other | Admitting: Family

## 2023-06-12 DIAGNOSIS — F321 Major depressive disorder, single episode, moderate: Secondary | ICD-10-CM

## 2023-06-12 DIAGNOSIS — G4709 Other insomnia: Secondary | ICD-10-CM

## 2023-06-12 NOTE — Progress Notes (Addendum)
This service is provided via telemedicine  No vital signs collected/recorded due to the encounter was a telemedicine visit.   Location of patient (ex: home, work):  home  Patient consents to a telephone visit:  yes  Location of the provider (ex: office, home):  office  Name of any referring provider:  N/A  Names of all persons participating in the telemedicine service and their role in the encounter:  Richarda Blade, NP and Frederich Chick  Time spent on call:  5 minutes     Provider: Dinah Ngetich FNP-C  Ngetich, Donalee Citrin, NP  Patient Care Team: Ngetich, Donalee Citrin, NP as PCP - General (Family Medicine) Ob/Gyn, [facility] (Obstetrics and Gynecology)  Extended Emergency Contact Information Primary Emergency Contact: [employer] Mobile Phone: [phone] Relation: Grandmother Secondary Emergency Contact: Dungee,Timothy Mobile Phone: [phone] Relation: Friend Preferred language: [place] Interpreter needed? No  Code Status:  Full Code  Goals of care: Advanced Directive information    06/12/2023    1:53 PM  Advanced Directives  Does Patient Have a Medical Advance Directive? No     Chief Complaint  Patient presents with   Follow-up    HPI:  Pt is a 32 y.o. female seen today for an acute visit for clearance to return to work.Has been out since may,2024 due to anxiety and depression.States has improved following up with a counselor every two weeks. Takes melatonin as needed for sleep.  Appetite has improved.  She denies any other acute issues.    Past Medical History:  Diagnosis Date   Anemia    Trichomonas    Past Surgical History:  Procedure Laterality Date   COSMETIC SURGERY      Allergies  Allergen Reactions   Metronidazole Nausea And Vomiting    Outpatient Encounter Medications as of 06/12/2023  Medication Sig   melatonin 3 MG TABS tablet Take 1 tablet (3 mg total) by mouth at bedtime.   traZODone (DESYREL) 50 MG tablet Take 0.5-1 tablets  (25-50 mg total) by mouth at bedtime as needed for sleep.   No facility-administered encounter medications on file as of 06/12/2023.    Review of Systems  Constitutional:  Negative for appetite change, chills, fatigue, fever and unexpected weight change.  Respiratory:  Negative for cough, chest tightness, shortness of breath and wheezing.   Cardiovascular:  Negative for chest pain, palpitations and leg swelling.  Gastrointestinal:  Negative for abdominal distention, abdominal pain, nausea and vomiting.  Musculoskeletal:  Negative for arthralgias, back pain, gait problem, joint swelling, myalgias, neck pain and neck stiffness.  Skin:  Negative for color change, pallor and rash.  Neurological:  Negative for dizziness, weakness, light-headedness, numbness and headaches.  Psychiatric/Behavioral:  Negative for agitation, behavioral problems, confusion, self-injury, sleep disturbance and suicidal ideas. The patient is not nervous/anxious.        Follows up with a counselor for anxiety and depression     Immunization History  Administered Date(s) Administered   Tdap 10/07/2011, 05/04/2018   Pertinent  Health Maintenance Due  Topic Date Due   INFLUENZA VACCINE  Never done      01/19/2022   11:49 AM 06/14/2022    7:30 PM 07/13/2022    6:48 PM 07/15/2022   12:35 AM 06/12/2023    1:53 PM  Fall Risk  Falls in the past year?     0  (RETIRED) Patient Fall Risk Level Low fall risk Low fall risk Low fall risk Low fall risk    Functional Status Survey:  There were no vitals filed for this visit. There is no height or weight on file to calculate BMI. Physical Exam Constitutional:      Appearance: She is not ill-appearing.  Pulmonary:     Effort: Pulmonary effort is normal. No respiratory distress.  Neurological:     Mental Status: She is alert and oriented to person, place, and time.  Psychiatric:        Mood and Affect: Mood normal.        Behavior: Behavior normal.        Thought  Content: Thought content normal.        Judgment: Judgment normal.    Labs reviewed: No results for input(s): "NA", "K", "CL", "CO2", "GLUCOSE", "BUN", "CREATININE", "CALCIUM", "MG", "PHOS" in the last 8760 hours. No results for input(s): "AST", "ALT", "ALKPHOS", "BILITOT", "PROT", "ALBUMIN" in the last 8760 hours. No results for input(s): "WBC", "NEUTROABS", "HGB", "HCT", "MCV", "PLT" in the last 8760 hours. No results found for: "TSH" No results found for: "HGBA1C" No results found for: "CHOL", "HDL", "LDLCALC", "LDLDIRECT", "TRIG", "CHOLHDL"  Significant Diagnostic Results in last 30 days:  No results found.  Assessment/Plan 1. Other insomnia Reports improvement with melatonin at bedtime. - sleep hygiene advised  -Continue on melatonin and trazodone as needed  2. Current moderate episode of major depressive disorder without prior episode (HCC) -Mood has improved - declined antidepressant  -Continue to follow-up with a counselor -Requests letter to return to work.Letter written will be sent via Mychart as requested may return to work on 06/13/2023  - advised to notify provider if symptoms worsen   Family/ staff Communication: Reviewed plan of care with patient verbalized understanding  Labs/tests ordered: None   Next Appointment: Return if symptoms worsen or fail to improve.  I connected with  Frederich Chick on 06/12/23 by a video enabled telemedicine application and verified that I am speaking with the correct person using two identifiers.   I discussed the limitations of evaluation and management by telemedicine. The patient expressed understanding and agreed to proceed.   Spent 12 minutes of ace to face with patient  >50% time spent counseling; reviewing medical record; labs; and developing future plan of care.   Caesar Bookman, NP

## 2023-06-13 DIAGNOSIS — Z419 Encounter for procedure for purposes other than remedying health state, unspecified: Secondary | ICD-10-CM | POA: Diagnosis not present

## 2023-06-16 ENCOUNTER — Telehealth: Payer: Medicaid Other

## 2023-06-16 NOTE — Telephone Encounter (Signed)
Left message for patient about updated letter she requested.  It is ready to pick up.

## 2023-06-18 ENCOUNTER — Encounter: Payer: Self-pay | Admitting: Family

## 2023-06-18 DIAGNOSIS — F321 Major depressive disorder, single episode, moderate: Secondary | ICD-10-CM | POA: Insufficient documentation

## 2023-06-18 DIAGNOSIS — G4709 Other insomnia: Secondary | ICD-10-CM | POA: Insufficient documentation

## 2023-06-29 ENCOUNTER — Ambulatory Visit (HOSPITAL_COMMUNITY): Payer: Self-pay | Admitting: Licensed Clinical Social Worker

## 2023-07-14 DIAGNOSIS — Z419 Encounter for procedure for purposes other than remedying health state, unspecified: Secondary | ICD-10-CM | POA: Diagnosis not present

## 2023-07-19 ENCOUNTER — Encounter (HOSPITAL_COMMUNITY): Payer: Self-pay

## 2023-07-19 ENCOUNTER — Ambulatory Visit (HOSPITAL_COMMUNITY): Payer: Medicaid Other | Admitting: Licensed Clinical Social Worker

## 2023-08-02 ENCOUNTER — Ambulatory Visit
Admission: EM | Admit: 2023-08-02 | Discharge: 2023-08-02 | Disposition: A | Discharge status: Home / Self Care | Payer: Medicaid Other | Attending: Internal Medicine | Admitting: Internal Medicine

## 2023-08-02 DIAGNOSIS — Z113 Encounter for screening for infections with a predominantly sexual mode of transmission: Secondary | ICD-10-CM | POA: Insufficient documentation

## 2023-08-02 NOTE — ED Triage Notes (Signed)
Pt presents for STD testing. Does not want blood work.

## 2023-08-02 NOTE — ED Provider Notes (Signed)
[facility]    CSN: [id number] Arrival date & time: 08/02/23  1915      History   Chief Complaint Chief Complaint  Patient presents with   SEXUALLY TRANSMITTED DISEASE    HPI Cheryl Benson is a 32 y.o. female presents for STD screening.  She currently denies any symptoms including dysuria, vaginal discharge, fevers, nausea/vomiting, flank pain.  No known exposure.  No other concerns at this time.  HPI  Past Medical History:  Diagnosis Date   Anemia    Trichomonas     Patient Active Problem List   Diagnosis Date Noted   Other insomnia 06/18/2023   Current moderate episode of major depressive disorder without prior episode (HCC) 06/18/2023   Spontaneous vaginal delivery 10/06/2011    Past Surgical History:  Procedure Laterality Date   COSMETIC SURGERY      OB History     Gravida  3   Para  2   Term  2   Preterm      AB      Living  2      SAB      IAB      Ectopic      Multiple      Live Births  2            Home Medications    Prior to Admission medications   Medication Sig Start Date End Date Taking? Authorizing Provider  melatonin 3 MG TABS tablet Take 1 tablet (3 mg total) by mouth at bedtime. 02/09/23   Ngetich, Dinah C, NP  traZODone (DESYREL) 50 MG tablet Take 0.5-1 tablets (25-50 mg total) by mouth at bedtime as needed for sleep. 02/09/23   Ngetich, Donalee Citrin, NP    Family History Family History  Problem Relation Age of Onset   Hypertension Father    Other Brother        Murdered    Social History Social History   Tobacco Use   Smoking status: Never   Smokeless tobacco: Never  Vaping Use   Vaping status: Never Used  Substance Use Topics   Alcohol use: Yes    Comment: Occasionally   Drug use: Never     Allergies   Metronidazole   Review of Systems Review of Systems  Genitourinary:        STD screening     Physical Exam Triage Vital Signs ED Triage Vitals  Encounter Vitals Group     BP --       Systolic BP Percentile --      Diastolic BP Percentile --      Pulse --      Resp 08/02/23 1923 (P) 17     Temp --      Temp src --      SpO2 --      Weight --      Height --      Head Circumference --      Peak Flow --      Pain Score 08/02/23 1922 0     Pain Loc --      Pain Education --      Exclude from Growth Chart --    No data found.  Updated Vital Signs Resp (P) 17   Visual Acuity Right Eye Distance:   Left Eye Distance:   Bilateral Distance:    Right Eye Near:   Left Eye Near:    Bilateral Near:     Physical Exam  Vitals and nursing note reviewed.  Constitutional:      Appearance: Normal appearance.  HENT:     Head: Normocephalic and atraumatic.  Eyes:     Pupils: Pupils are equal, round, and reactive to light.  Cardiovascular:     Rate and Rhythm: Normal rate.  Pulmonary:     Effort: Pulmonary effort is normal.  Abdominal:     Tenderness: There is no right CVA tenderness or left CVA tenderness.  Skin:    General: Skin is warm and dry.  Neurological:     General: No focal deficit present.     Mental Status: She is alert and oriented to person, place, and time.  Psychiatric:        Mood and Affect: Mood normal.        Behavior: Behavior normal.      UC Treatments / Results  Labs (all labs ordered are listed, but only abnormal results are displayed) Labs Reviewed  CERVICOVAGINAL ANCILLARY ONLY    EKG   Radiology No results found.  Procedures Procedures (including critical care time)  Medications Ordered in UC Medications - No data to display  Initial Impression / Assessment and Plan / UC Course  I have reviewed the triage vital signs and the nursing notes.  Pertinent labs & imaging results that were available during my care of the patient were reviewed by me and considered in my medical decision making (see chart for details).     STD testing is ordered and will contact for any positive results.  Patient to follow-up as  needed. Final Clinical Impressions(s) / UC Diagnoses   Final diagnoses:  Screening examination for STD (sexually transmitted disease)     Discharge Instructions      The clinic will contact you for any positive results of the testing done today.  Follow-up with your PCP or gynecologist as needed.    ED Prescriptions   None    PDMP not reviewed this encounter.   Radford Pax, NP 08/02/23 1946

## 2023-08-02 NOTE — Discharge Instructions (Addendum)
The clinic will contact you for any positive results of the testing done today.  Follow-up with your PCP or gynecologist as needed.

## 2023-08-04 LAB — CERVICOVAGINAL ANCILLARY ONLY
Chlamydia: POSITIVE — AB
Comment: NEGATIVE
Comment: NEGATIVE
Comment: NORMAL
Neisseria Gonorrhea: NEGATIVE
Trichomonas: POSITIVE — AB

## 2023-08-05 ENCOUNTER — Telehealth: Payer: Self-pay

## 2023-08-06 ENCOUNTER — Telehealth: Payer: Self-pay

## 2023-08-06 MED ORDER — METRONIDAZOLE 500 MG PO TABS: 500.0000 mg | ORAL_TABLET | Freq: Two times a day (BID) | ORAL | 0 refills | Status: DC

## 2023-08-06 MED ORDER — DOXYCYCLINE HYCLATE 100 MG PO CAPS: 100.0000 mg | ORAL_CAPSULE | Freq: Two times a day (BID) | ORAL | 0 refills | Status: DC

## 2023-08-13 DIAGNOSIS — Z419 Encounter for procedure for purposes other than remedying health state, unspecified: Secondary | ICD-10-CM | POA: Diagnosis not present

## 2023-09-13 DIAGNOSIS — Z419 Encounter for procedure for purposes other than remedying health state, unspecified: Secondary | ICD-10-CM | POA: Diagnosis not present

## 2023-10-14 DIAGNOSIS — Z419 Encounter for procedure for purposes other than remedying health state, unspecified: Secondary | ICD-10-CM | POA: Diagnosis not present

## 2023-11-11 DIAGNOSIS — Z419 Encounter for procedure for purposes other than remedying health state, unspecified: Secondary | ICD-10-CM | POA: Diagnosis not present

## 2023-11-20 ENCOUNTER — Encounter: Payer: Medicaid Other | Admitting: Family

## 2023-11-26 NOTE — Progress Notes (Signed)
   This encounter was created in error - please disregard. No show

## 2023-12-23 DIAGNOSIS — Z419 Encounter for procedure for purposes other than remedying health state, unspecified: Secondary | ICD-10-CM | POA: Diagnosis not present

## 2024-01-19 ENCOUNTER — Encounter: Payer: No Typology Code available for payment source | Admitting: Family

## 2024-01-22 ENCOUNTER — Ambulatory Visit: Admitting: Family

## 2024-01-22 ENCOUNTER — Encounter: Payer: Self-pay | Admitting: Family

## 2024-01-22 VITALS — BP 116/72 | HR 73 | Temp 97.8°F | Resp 20 | Ht 63.0 in | Wt 181.6 lb

## 2024-01-22 DIAGNOSIS — Z113 Encounter for screening for infections with a predominantly sexual mode of transmission: Secondary | ICD-10-CM | POA: Diagnosis not present

## 2024-01-22 DIAGNOSIS — Z0000 Encounter for general adult medical examination without abnormal findings: Secondary | ICD-10-CM | POA: Diagnosis not present

## 2024-01-22 DIAGNOSIS — Z1159 Encounter for screening for other viral diseases: Secondary | ICD-10-CM | POA: Diagnosis not present

## 2024-01-22 DIAGNOSIS — Z419 Encounter for procedure for purposes other than remedying health state, unspecified: Secondary | ICD-10-CM | POA: Diagnosis not present

## 2024-01-24 LAB — COMPLETE METABOLIC PANEL WITHOUT GFR
AG Ratio: 1.3 (calc) (ref 1.0–2.5)
ALT: 12 U/L (ref 6–29)
AST: 14 U/L (ref 10–30)
Albumin: 3.9 g/dL (ref 3.6–5.1)
Alkaline phosphatase (APISO): 75 U/L (ref 31–125)
BUN: 13 mg/dL (ref 7–25)
CO2: 28 mmol/L (ref 20–32)
Calcium: 9.2 mg/dL (ref 8.6–10.2)
Chloride: 106 mmol/L (ref 98–110)
Creat: 0.68 mg/dL (ref 0.50–0.97)
Globulin: 3.1 g/dL (ref 1.9–3.7)
Glucose, Bld: 79 mg/dL (ref 65–139)
Potassium: 4.3 mmol/L (ref 3.5–5.3)
Sodium: 140 mmol/L (ref 135–146)
Total Bilirubin: 0.5 mg/dL (ref 0.2–1.2)
Total Protein: 7 g/dL (ref 6.1–8.1)

## 2024-01-24 LAB — CBC WITH DIFFERENTIAL/PLATELET
Absolute Lymphocytes: 2144 {cells}/uL (ref 850–3900)
Absolute Monocytes: 408 {cells}/uL (ref 200–950)
Basophils Absolute: 40 {cells}/uL (ref 0–200)
Basophils Relative: 0.5 %
Eosinophils Absolute: 192 {cells}/uL (ref 15–500)
Eosinophils Relative: 2.4 %
HCT: 40.9 % (ref 35.0–45.0)
Hemoglobin: 13.4 g/dL (ref 11.7–15.5)
MCH: 29.8 pg (ref 27.0–33.0)
MCHC: 32.8 g/dL (ref 32.0–36.0)
MCV: 90.9 fL (ref 80.0–100.0)
MPV: 10.2 fL (ref 7.5–12.5)
Monocytes Relative: 5.1 %
Neutro Abs: 5216 {cells}/uL (ref 1500–7800)
Neutrophils Relative %: 65.2 %
Platelets: 291 10*3/uL (ref 140–400)
RBC: 4.5 10*6/uL (ref 3.80–5.10)
RDW: 12.5 % (ref 11.0–15.0)
Total Lymphocyte: 26.8 %
WBC: 8 10*3/uL (ref 3.8–10.8)

## 2024-01-24 LAB — HIV ANTIBODY (ROUTINE TESTING W REFLEX): HIV 1&2 Ab, 4th Generation: NONREACTIVE

## 2024-01-24 LAB — RPR: RPR Ser Ql: NONREACTIVE

## 2024-01-24 LAB — TSH: TSH: 0.51 m[IU]/L

## 2024-01-24 LAB — HEPATITIS C ANTIBODY: Hepatitis C Ab: NONREACTIVE

## 2024-01-25 LAB — CHLAMYDIA/NEISSERIA GONORRHOEAE RNA,TMA,UROGENTIAL
C. trachomatis RNA, TMA: NOT DETECTED
N. gonorrhoeae RNA, TMA: NOT DETECTED

## 2024-01-28 NOTE — Progress Notes (Signed)
 Provider: Christean Courts FNP-C   Evarose Altland, Elijio Guadeloupe, NP  Patient Care Team: Rice Walsh, Elijio Guadeloupe, NP as PCP - General (Family Medicine) Ob/Gyn, [facility] (Obstetrics and Gynecology)  Extended Emergency Contact Information Primary Emergency Contact: [employer] Mobile Phone: [phone] Relation: Grandmother  Code Status:  Full Code  Goals of care: Advanced Directive information    01/22/2024   12:54 PM  Advanced Directives  Does Patient Have a Medical Advance Directive? No  Would patient like information on creating a medical advance directive? No - Patient declined     Chief Complaint  Patient presents with   Annual Exam    Physical     Discussed the use of AI scribe software for clinical note transcription with the patient, who gave verbal consent to proceed.  History of Present Illness   Jamyah Folk is a 33 year old female who presents for an annual physical exam and STD testing.  She has requested testing for gonorrhea, chlamydia, HIV, and syphilis due to having a new sexual partner within the last year.  She experiences frequent headaches and manages them with 800 mg of ibuprofen  as needed, ensuring to eat beforehand to prevent stomach irritation. No depression, anxiety, or other neurological symptoms are reported.  She has a history of anxiety, which she describes as manageable, and is no longer taking trazodone  for sleep. Currently, she is only taking birth control pills.  She mentions a past bacterial vaginosis treated with antibiotics, including a one-time dose of Flagyl . She is prone to bacterial infections but not urinary tract infections.  Her appetite is excessive, and she tries to eat well-balanced meals. She reports irregular menstrual cycles despite being on birth control, sometimes experiencing two periods a month.  She is an occasional drinker but consumed more alcohol than usual over the past weekend, approximately seven drinks. She engages in  daily walking as her primary form of exercise and does not consume pork or much red meat, preferring chicken and fish.         Past Medical History:  Diagnosis Date   Anemia    Trichomonas    Past Surgical History:  Procedure Laterality Date   COSMETIC SURGERY      Allergies  Allergen Reactions   Metronidazole  Nausea And Vomiting    Allergies as of 01/22/2024       Reactions   Metronidazole  Nausea And Vomiting        Medication List        Accurate as of Jan 22, 2024 11:59 PM. If you have any questions, ask your nurse or doctor.          STOP taking these medications    doxycycline  100 MG capsule Commonly known as: VIBRAMYCIN  Stopped by: Jayleen Scaglione C Lithzy Bernard   doxycycline  100 MG tablet Commonly known as: ADOXA Stopped by: Alassane Kalafut C Shabria Egley   melatonin 3 MG Tabs tablet Stopped by: Elijio Guadeloupe Adelaido Nicklaus   metroNIDAZOLE  500 MG tablet Commonly known as: FLAGYL  Stopped by: Reyah Streeter C Ramonita Koenig   traZODone  50 MG tablet Commonly known as: DESYREL  Stopped by: Paxtyn Wisdom C Riggins Cisek       TAKE these medications    LO LOESTRIN FE PO Take by mouth daily.        Review of Systems  Constitutional:  Negative for appetite change, chills, fatigue, fever and unexpected weight change.  HENT:  Negative for congestion, dental problem, ear discharge, ear pain, facial swelling, hearing loss, nosebleeds, postnasal drip, rhinorrhea, sinus pressure, sinus pain, sneezing,  sore throat, tinnitus and trouble swallowing.   Eyes:  Negative for pain, discharge, redness, itching and visual disturbance.  Respiratory:  Negative for cough, chest tightness, shortness of breath and wheezing.   Cardiovascular:  Negative for chest pain, palpitations and leg swelling.  Gastrointestinal:  Negative for abdominal distention, abdominal pain, blood in stool, constipation, diarrhea, nausea and vomiting.  Endocrine: Negative for cold intolerance, heat intolerance, polydipsia, polyphagia and polyuria.   Genitourinary:  Positive for menstrual problem. Negative for difficulty urinating, dysuria, flank pain, frequency and urgency.       Irregular menses   Musculoskeletal:  Negative for arthralgias, back pain, gait problem, joint swelling, myalgias, neck pain and neck stiffness.  Skin:  Negative for color change, pallor, rash and wound.  Neurological:  Positive for headaches. Negative for dizziness, syncope, speech difficulty, weakness, light-headedness and numbness.  Hematological:  Does not bruise/bleed easily.  Psychiatric/Behavioral:  Negative for agitation, behavioral problems, confusion, hallucinations, self-injury, sleep disturbance and suicidal ideas. The patient is not nervous/anxious.        Anxiety well controlled     Immunization History  Administered Date(s) Administered   Tdap 10/07/2011, 05/04/2018   Pertinent  Health Maintenance Due  Topic Date Due   INFLUENZA VACCINE  04/12/2024      06/14/2022    7:30 PM 07/13/2022    6:48 PM 07/15/2022   12:35 AM 06/12/2023    1:53 PM 01/22/2024   12:52 PM  Fall Risk  Falls in the past year?    0 0  Was there an injury with Fall?     0  Fall Risk Category Calculator     0  (RETIRED) Patient Fall Risk Level Low fall risk Low fall risk Low fall risk    Patient at Risk for Falls Due to     No Fall Risks  Fall risk Follow up     Falls evaluation completed   Functional Status Survey:    Vitals:   01/22/24 1257  BP: 116/72  Pulse: 73  Resp: 20  Temp: 97.8 F (36.6 C)  SpO2: 98%  Weight: 181 lb 9.6 oz (82.4 kg)  Height: 5\' 3"  (1.6 m)   Body mass index is 32.17 kg/m. Physical Exam VITALS: T- 97.8, P- 73, BP- 116/72, SaO2- 98% MEASUREMENTS: Weight- 181.6. GENERAL: Alert, cooperative, well developed, no acute distress. HEENT: Normocephalic, normal oropharynx, moist mucous membranes. CHEST: Clear to auscultation bilaterally, no wheezes, rhonchi, or crackles. CARDIOVASCULAR: Normal heart rate and rhythm, S1 and S2 normal  without murmurs. ABDOMEN: Soft, non-tender, non-distended, without organomegaly, normal bowel sounds. EXTREMITIES: No cyanosis, edema, or swelling in legs. MUSCULOSKELETAL: Hips and knees with normal range of motion. NEUROLOGICAL: Cranial nerves grossly intact, moves all extremities without gross motor or sensory deficit, reflexes normal.  SKIN: No rash,no lesion or erythema   PSYCHIATRY/BEHAVIORAL: Mood stable  Labs reviewed: Recent Labs    01/22/24 1331  NA 140  K 4.3  CL 106  CO2 28  GLUCOSE 79  BUN 13  CREATININE 0.68  CALCIUM 9.2   Recent Labs    01/22/24 1331  AST 14  ALT 12  BILITOT 0.5  PROT 7.0   Recent Labs    01/22/24 1331  WBC 8.0  NEUTROABS 5,216  HGB 13.4  HCT 40.9  MCV 90.9  PLT 291   Lab Results  Component Value Date   TSH 0.51 01/22/2024   No results found for: "HGBA1C" No results found for: "CHOL", "HDL", "LDLCALC", "LDLDIRECT", "TRIG", "CHOLHDL"  Significant Diagnostic Results in last 30 days:  No results found.  Assessment/Plan  Headaches Frequent headaches managed with 800 mg ibuprofen . Advised to eat before taking ibuprofen  to prevent gastric irritation and potential bleeding. - Advise eating before taking ibuprofen   Irregular menstruation Irregular menstruation with occasional two periods per month while on birth control pill.  Anxiety Anxiety manageable without medication. No longer taking trazodone  for sleep. No current symptoms of depression or anxiety.  Annual Physical exam Routine wellness examination with vital signs within normal limits. Discussed healthy eating and regular exercise to prevent chronic illnesses such as hypertension and diabetes, especially as approaching age 46. Encouraged reduction of high-fat and high-sugar foods. Discussed maintaining flexibility and using protection to prevent STDs. Advised on potential difficulty of losing weight after age 66 and increased risk of chronic illnesses. - Order CBC and  CMP - Order STD screening including gonorrhea, chlamydia, HIV, and syphilis - Schedule Pap smear - Encourage healthy eating and regular exercise - Advise use of protection to prevent STDs  General Health Maintenance Immunizations up to date except for COVID vaccine. Overdue for Pap smear. Discussed scheduling Pap smear at this clinic due to difficulty accessing gynecologist. - Schedule Pap smear - Update COVID vaccination  Family/ staff Communication: Reviewed plan of care with patient verbalized understanding   Labs/tests ordered:  - Chlamydia /Neisseria/Gonorrhoeae - CBC with Differential/Platelet - CMP with eGFR([facility]) - TSH - Hgb A1C - Lipid panel - RPR - Hep C Antibody - HIV Antibody (routine testing w rflx)   Next Appointment : Return in about 1 year (around 01/21/2025) for annual Physical examination. Pap smear in 1 month .   Estil Heman, NP

## 2024-02-07 DIAGNOSIS — H5213 Myopia, bilateral: Secondary | ICD-10-CM | POA: Diagnosis not present

## 2024-02-08 ENCOUNTER — Ambulatory Visit: Payer: Self-pay | Admitting: Family

## 2024-02-19 ENCOUNTER — Ambulatory Visit
Admission: EM | Admit: 2024-02-19 | Discharge: 2024-02-19 | Disposition: A | Discharge status: Home / Self Care | Attending: Emergency Medicine | Admitting: Emergency Medicine

## 2024-02-19 DIAGNOSIS — B354 Tinea corporis: Secondary | ICD-10-CM | POA: Diagnosis not present

## 2024-02-19 MED ORDER — MICONAZOLE NITRATE 2 % EX CREA: 1.0000 | TOPICAL_CREAM | Freq: Two times a day (BID) | CUTANEOUS | 0 refills | Status: AC

## 2024-02-19 NOTE — ED Provider Notes (Signed)
 Cheryl Benson    CSN: [id number] Arrival date & time: 02/19/24  1444      History   Chief Complaint Chief Complaint  Patient presents with   Rash    HPI Cheryl Benson is a 33 y.o. female.  Patient presents with pruritic rash on her left upper chest x 2 weeks.  She has been treating this with antifungal cream for the last 3 days.  Previously she attempted treatment with eczema cream.  No fever or drainage.  The history is provided by the patient and medical records.    Past Medical History:  Diagnosis Date   Anemia    Trichomonas     Patient Active Problem List   Diagnosis Date Noted   Other insomnia 06/18/2023   Current moderate episode of major depressive disorder without prior episode (HCC) 06/18/2023   Spontaneous vaginal delivery 10/06/2011    Past Surgical History:  Procedure Laterality Date   COSMETIC SURGERY      OB History     Gravida  3   Para  2   Term  2   Preterm      AB      Living  2      SAB      IAB      Ectopic      Multiple      Live Births  2            Home Medications    Prior to Admission medications   Medication Sig Start Date End Date Taking? Authorizing Provider  miconazole (MICOTIN) 2 % cream Apply 1 Application topically 2 (two) times daily. 02/19/24  Yes Wellington Half, NP  Norethin-Eth Estrad-Fe Biphas (LO LOESTRIN FE PO) Take by mouth daily.    [provider]    Family History Family History  Problem Relation Age of Onset   Hypertension Father    Other Brother        Murdered    Social History Social History   Tobacco Use   Smoking status: Never   Smokeless tobacco: Never  Vaping Use   Vaping status: Never Used  Substance Use Topics   Alcohol use: Yes    Comment: Occasionally   Drug use: Never     Allergies   Metronidazole    Review of Systems Review of Systems  Constitutional:  Negative for chills and fever.  Skin:  Positive for color change and rash.      Physical Exam Triage Vital Signs ED Triage Vitals  Encounter Vitals Group     BP 02/19/24 1455 111/72     Systolic BP Percentile --      Diastolic BP Percentile --      Pulse Rate 02/19/24 1455 98     Resp 02/19/24 1455 18     Temp 02/19/24 1455 98.1 F (36.7 C)     Temp src --      SpO2 02/19/24 1455 100 %     Weight --      Height --      Head Circumference --      Peak Flow --      Pain Score 02/19/24 1451 0     Pain Loc --      Pain Education --      Exclude from Growth Chart --    No data found.  Updated Vital Signs BP 111/72   Pulse 98   Temp 98.1 F (36.7 C)   Resp 18  SpO2 100%   Visual Acuity Right Eye Distance:   Left Eye Distance:   Bilateral Distance:    Right Eye Near:   Left Eye Near:    Bilateral Near:     Physical Exam Constitutional:      General: She is not in acute distress. HENT:     Mouth/Throat:     Mouth: Mucous membranes are moist.  Cardiovascular:     Rate and Rhythm: Normal rate and regular rhythm.  Pulmonary:     Effort: Pulmonary effort is normal. No respiratory distress.  Skin:    General: Skin is warm and dry.     Findings: Lesion present.     Comments: Left upper chest: 2 cm circular, mildly erythematous lesion with raised border. No drainage.   Neurological:     Mental Status: She is alert.      UC Treatments / Results  Labs (all labs ordered are listed, but only abnormal results are displayed) Labs Reviewed - No data to display  EKG   Radiology No results found.  Procedures Procedures (including critical care time)  Medications Ordered in UC Medications - No data to display  Initial Impression / Assessment and Plan / UC Course  I have reviewed the triage vital signs and the nursing notes.  Pertinent labs & imaging results that were available during my care of the patient were reviewed by me and considered in my medical decision making (see chart for details).   Tinea corporis.  Patient has  been using clotrimazole antifungal cream.  Instructed her to continue this as directed or change to miconazole cream.  Education provided on body ringworm.  Instructed patient to follow up with her PCP if her symptoms are not improving.  She agrees to plan of care.  Final Clinical Impressions(s) / UC Diagnoses   Final diagnoses:  Tinea corporis     Discharge Instructions      Use the antifungal cream as directed.  Follow-up with your primary care provider if your symptoms are not improving.    ED Prescriptions     Medication Sig Dispense Auth. Provider   miconazole (MICOTIN) 2 % cream Apply 1 Application topically 2 (two) times daily. 28.35 g Wellington Half, NP      PDMP not reviewed this encounter.   Wellington Half, NP 02/19/24 [phone]

## 2024-02-19 NOTE — Discharge Instructions (Addendum)
Use the antifungal cream as directed.  Follow up with your primary care provider if your symptoms are not improving.     

## 2024-02-19 NOTE — ED Triage Notes (Addendum)
 Patient to Urgent Care with complaints of a rash present to her left chest.   Symptoms started two weeks ago.   Using anti-fungal cream (x3 days)/ used her eczema cream.

## 2024-02-22 DIAGNOSIS — Z419 Encounter for procedure for purposes other than remedying health state, unspecified: Secondary | ICD-10-CM | POA: Diagnosis not present

## 2024-03-20 DIAGNOSIS — Z0120 Encounter for dental examination and cleaning without abnormal findings: Secondary | ICD-10-CM | POA: Diagnosis not present

## 2024-03-23 DIAGNOSIS — Z419 Encounter for procedure for purposes other than remedying health state, unspecified: Secondary | ICD-10-CM | POA: Diagnosis not present

## 2024-04-23 DIAGNOSIS — Z419 Encounter for procedure for purposes other than remedying health state, unspecified: Secondary | ICD-10-CM | POA: Diagnosis not present

## 2024-05-24 DIAGNOSIS — Z419 Encounter for procedure for purposes other than remedying health state, unspecified: Secondary | ICD-10-CM | POA: Diagnosis not present

## 2024-06-10 ENCOUNTER — Other Ambulatory Visit: Payer: Self-pay | Admitting: Family

## 2024-06-10 NOTE — Telephone Encounter (Signed)
 Pharmacy requested refill.  ?Pended Rx and sent to [facility] for approval.  ?

## 2024-06-19 ENCOUNTER — Ambulatory Visit: Payer: Self-pay

## 2024-06-19 NOTE — Telephone Encounter (Signed)
 I agree with plan. please schedule appointment for evaluation of breast pain

## 2024-06-19 NOTE — Telephone Encounter (Signed)
 Message routed to admin staff to call and schedule appointment for whoever is available.

## 2024-06-19 NOTE — Telephone Encounter (Signed)
 FYI Only or Action Required?: FYI only for provider.  Patient was last seen in primary care on 01/22/2024 by Ngetich, Roxan BROCKS, NP.  Called Nurse Triage reporting Breast Pain.  Symptoms began several weeks ago.  Interventions attempted: Nothing.  Symptoms are: unchanged.  Triage Disposition: See PCP Within 2 Weeks  Patient/caregiver understands and will follow disposition?: Yes Copied from CRM [id number]. Topic: Clinical - Red Word Triage >> Jun 19, 2024 11:32 AM Susanna ORN wrote: Red Word that prompted transfer to Nurse Triage: Patient states she's been having pain in right breast. States it comes and goes and has been like this for about 2 weeks. States she does have a family history of breast issues on maternal side & wants to be checked out. Spoke with Darice at [facility] and she stated patient would have to come in to be seen for a referral if needing things like a mammogram. Reason for Disposition  [1] Breast pain AND [2] cause is not known  Answer Assessment - Initial Assessment Questions 1. SYMPTOM: What's the main symptom you're concerned about?  (e.g., lump, nipple discharge, pain, rash)     Right breast pain, 2. LOCATION: Where is the pain located?     Right breast closer to middle of chest   3. ONSET: When did *No Answer*  start?     *No Answer* 4. PRIOR HISTORY: Do you have any history of prior problems with your breasts? (e.g., breast cancer, breast implant, fibrocystic breast disease)     *No Answer* 5. CAUSE: What do you think is causing this symptom?     Unsure of cause, but has history breast history mother side  6. OTHER SYMPTOMS: Do you have any other symptoms? (e.g., breast pain, fever, nipple discharge, redness or rash)     No  7. PREGNANCY-BREASTFEEDING: Is there any chance you are pregnant? When was your last menstrual period? Are you breastfeeding?     LMP-on cycle now but had one 2 weeks ago  Protocols used: Breast Symptoms-A-AH

## 2024-06-19 NOTE — Telephone Encounter (Signed)
 Message routed to PCP Ngetich, Elijio Guadeloupe, NP as FYI

## 2024-06-20 NOTE — Telephone Encounter (Signed)
 The patient is scheduled for 06/26/2024 at 3:20.

## 2024-06-26 ENCOUNTER — Ambulatory Visit (INDEPENDENT_AMBULATORY_CARE_PROVIDER_SITE_OTHER): Admitting: Family

## 2024-06-26 ENCOUNTER — Encounter: Payer: Self-pay | Admitting: Family

## 2024-06-26 VITALS — BP 124/76 | HR 76 | Temp 97.6°F | Resp 18 | Ht 63.0 in | Wt 182.4 lb

## 2024-06-26 DIAGNOSIS — D509 Iron deficiency anemia, unspecified: Secondary | ICD-10-CM

## 2024-06-26 DIAGNOSIS — N644 Mastodynia: Secondary | ICD-10-CM

## 2024-07-04 ENCOUNTER — Other Ambulatory Visit: Payer: Self-pay | Admitting: Family

## 2024-07-04 DIAGNOSIS — N644 Mastodynia: Secondary | ICD-10-CM

## 2024-07-07 NOTE — Progress Notes (Signed)
 Provider: Roxan Plough FNP-C  Lodema Parma, Roxan BROCKS, NP  Patient Care Team: Acelynn Dejonge, Roxan BROCKS, NP as PCP - General (Family Medicine) Ob/Gyn, [facility] (Obstetrics and Gynecology)  Extended Emergency Contact Information Primary Emergency Contact: [employer] Mobile Phone: [phone] Relation: Grandmother  Code Status: Full Code  Goals of care: Advanced Directive information    06/26/2024    3:14 PM  Advanced Directives  Does Patient Have a Medical Advance Directive? No  Would patient like information on creating a medical advance directive? No - Patient declined     Chief Complaint  Patient presents with   Breast Pain    Discussed the use of AI scribe software for clinical note transcription with the patient, who gave verbal consent to proceed.  History of Present Illness   Cheryl Benson is a 33 year old female who presents with right-sided breast pain.  She has been experiencing intermittent sharp and achy pain in the upper right breast for the past two months. She is unsure about any associated swelling as she does not regularly perform breast self-exams. No breast discharge, fever, or chills.  There is a family history of breast cancer.   Past Medical History:  Diagnosis Date   Anemia    Trichomonas    Past Surgical History:  Procedure Laterality Date   COSMETIC SURGERY      Allergies  Allergen Reactions   Metronidazole  Nausea And Vomiting    Outpatient Encounter Medications as of 06/26/2024  Medication Sig   LO LOESTRIN FE 1 MG-10 MCG / 10 MCG tablet TAKE 1 TABLET BY MOUTH EVERY DAY   miconazole  (MICOTIN) 2 % cream Apply 1 Application topically 2 (two) times daily. (Patient not taking: Reported on 06/26/2024)   Norethin-Eth Estrad-Fe Biphas (LO LOESTRIN FE PO) Take by mouth daily. (Patient not taking: Reported on 06/26/2024)   No facility-administered encounter medications on file as of 06/26/2024.    Review of Systems  Constitutional:   Negative for appetite change, chills, fatigue, fever and unexpected weight change.  HENT:  Negative for congestion, ear discharge, ear pain, hearing loss, nosebleeds, postnasal drip, rhinorrhea, sinus pressure, sinus pain, sneezing, sore throat, tinnitus and trouble swallowing.   Eyes:  Negative for pain, discharge, redness, itching and visual disturbance.  Respiratory:  Negative for cough, chest tightness, shortness of breath and wheezing.        Right breast pain   Cardiovascular:  Negative for chest pain, palpitations and leg swelling.  Gastrointestinal:  Negative for abdominal distention, abdominal pain, nausea and vomiting.  Endocrine: Negative for cold intolerance, heat intolerance, polydipsia, polyphagia and polyuria.  Genitourinary:  Negative for difficulty urinating, dysuria, flank pain, frequency and urgency.  Musculoskeletal:  Negative for arthralgias, back pain, gait problem, joint swelling, myalgias, neck pain and neck stiffness.  Skin:  Negative for color change, pallor, rash and wound.  Neurological:  Negative for dizziness, weakness, light-headedness, numbness and headaches.    Immunization History  Administered Date(s) Administered   Hepatitis A 01/04/2008, 07/10/2008   Hepatitis B 11/22/1991, 05/29/1992   Hpv-Unspecified 01/04/2008, 05/16/2008   Tdap 10/07/2011, 05/04/2018   Pertinent  Health Maintenance Due  Topic Date Due   Influenza Vaccine  01/20/2025 (Originally 04/12/2024)      07/13/2022    6:48 PM 07/15/2022   12:35 AM 06/12/2023    1:53 PM 01/22/2024   12:52 PM 06/26/2024    3:13 PM  Fall Risk  Falls in the past year?   0 0 0  Was there an  injury with Fall?    0 0  Fall Risk Category Calculator    0 0  (RETIRED) Patient Fall Risk Level Low fall risk  Low fall risk      Patient at Risk for Falls Due to    No Fall Risks No Fall Risks  Fall risk Follow up    Falls evaluation completed Falls evaluation completed     Data saved with a previous flowsheet row  definition   Functional Status Survey:    Vitals:   06/26/24 1517  BP: 124/76  Pulse: 76  Resp: 18  Temp: 97.6 F (36.4 C)  SpO2: 97%  Weight: 182 lb 6.4 oz (82.7 kg)  Height: 5' 3 (1.6 m)   Body mass index is 32.31 kg/m. Physical Exam Physical Exam   VITALS: T- 97.6, P- 76, BP- 124/76, SaO2- 97% MEASUREMENTS: Weight- 182.4. GENERAL: Alert, cooperative, well developed, no acute distress. HEENT: Normocephalic, normal oropharynx, moist mucous membranes. CHEST: Clear to auscultation bilaterally, no wheezes, rhonchi, or crackles. CARDIOVASCULAR: Normal heart rate and rhythm, S1 and S2 normal without murmurs. BREAST: No nodules palpated bilaterally, no discharge from nipples. ABDOMEN: Soft, non-tender, non-distended, without organomegaly, normal bowel sounds. EXTREMITIES: No cyanosis or edema. NEUROLOGICAL: Cranial nerves grossly intact, moves all extremities without gross motor or sensory deficit.   Labs reviewed: Recent Labs    01/22/24 1331  NA 140  K 4.3  CL 106  CO2 28  GLUCOSE 79  BUN 13  CREATININE 0.68  CALCIUM 9.2   Recent Labs    01/22/24 1331  AST 14  ALT 12  BILITOT 0.5  PROT 7.0   Recent Labs    01/22/24 1331  WBC 8.0  NEUTROABS 5,216  HGB 13.4  HCT 40.9  MCV 90.9  PLT 291   Lab Results  Component Value Date   TSH 0.51 01/22/2024   No results found for: HGBA1C No results found for: CHOL, HDL, LDLCALC, LDLDIRECT, TRIG, CHOLHDL  Significant Diagnostic Results in last 30 days:  No results found.  Assessment/Plan  Right breast pain Intermittent right breast pain for two months with no palpable nodules or discharge.Chaperone Bethany Boomer,CMA present throughout examination. No fever or chills. Family history of breast cancer. - Order mammogram for further evaluation due to family history of breast cancer - Advise on regular breast self-examinations, preferably post-menstrual periods  Iron deficiency anemia  (suspected) Low hemoglobin levels suggestive of iron deficiency anemia. - Order iron, ferritin, and vitamin B12 levels for further evaluation - Review results of iron, ferritin, and vitamin B12 levels when available   Family/ staff Communication: Reviewed plan of care with patient verbalized understanding   Labs/tests ordered:  DG Mammogram right   Next Appointment: Return if symptoms worsen or fail to improve.   Total time: 20 minutes. Greater than 50% of total time spent doing patient education regarding Iron deficiency anemia,right breast pain,health maintenance including symptom/medication management.   Roxan JAYSON Plough, NP

## 2024-08-12 ENCOUNTER — Ambulatory Visit
Admission: RE | Admit: 2024-08-12 | Discharge: 2024-08-12 | Disposition: A | Source: Ambulatory Visit | Attending: Family | Admitting: Family

## 2024-08-12 ENCOUNTER — Ambulatory Visit

## 2024-08-12 DIAGNOSIS — R928 Other abnormal and inconclusive findings on diagnostic imaging of breast: Secondary | ICD-10-CM | POA: Diagnosis not present

## 2024-08-12 DIAGNOSIS — N644 Mastodynia: Secondary | ICD-10-CM
